# Patient Record
Sex: Female | Born: 1995 | Race: White | Hispanic: No | Marital: Married | Smoking: Never smoker
Health system: Southern US, Community
[De-identification: ages and names within clinical notes are randomized; demographics above are authoritative.]

## PROBLEM LIST (undated history)

## (undated) DIAGNOSIS — E119 Type 2 diabetes mellitus without complications: Secondary | ICD-10-CM

## (undated) HISTORY — PX: NO PAST SURGERIES: SHX2092

---
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
## 2015-03-16 ENCOUNTER — Encounter: Payer: Self-pay | Admitting: Emergency Medicine

## 2015-03-16 ENCOUNTER — Emergency Department
Admission: EM | Admit: 2015-03-16 | Discharge: 2015-03-16 | Disposition: A | Discharge status: Home / Self Care | Payer: No Typology Code available for payment source | Attending: Emergency Medicine | Admitting: Emergency Medicine

## 2015-03-16 DIAGNOSIS — Y998 Other external cause status: Secondary | ICD-10-CM | POA: Insufficient documentation

## 2015-03-16 DIAGNOSIS — S4992XA Unspecified injury of left shoulder and upper arm, initial encounter: Secondary | ICD-10-CM | POA: Diagnosis not present

## 2015-03-16 DIAGNOSIS — Y9389 Activity, other specified: Secondary | ICD-10-CM | POA: Insufficient documentation

## 2015-03-16 DIAGNOSIS — S199XXA Unspecified injury of neck, initial encounter: Secondary | ICD-10-CM | POA: Diagnosis present

## 2015-03-16 DIAGNOSIS — E119 Type 2 diabetes mellitus without complications: Secondary | ICD-10-CM | POA: Diagnosis not present

## 2015-03-16 DIAGNOSIS — S161XXA Strain of muscle, fascia and tendon at neck level, initial encounter: Secondary | ICD-10-CM | POA: Insufficient documentation

## 2015-03-16 DIAGNOSIS — Z794 Long term (current) use of insulin: Secondary | ICD-10-CM | POA: Insufficient documentation

## 2015-03-16 DIAGNOSIS — S3992XA Unspecified injury of lower back, initial encounter: Secondary | ICD-10-CM | POA: Insufficient documentation

## 2015-03-16 DIAGNOSIS — Y92093 Driveway of other non-institutional residence as the place of occurrence of the external cause: Secondary | ICD-10-CM | POA: Diagnosis not present

## 2015-03-16 HISTORY — DX: Type 2 diabetes mellitus without complications: E11.9

## 2015-03-16 MED ORDER — NAPROXEN 500 MG PO TABS
500.0000 mg | ORAL_TABLET | Freq: Once | ORAL | Status: DC
  Filled 2015-03-16: qty 1

## 2015-03-16 MED ORDER — IBUPROFEN 800 MG PO TABS
800.0000 mg | ORAL_TABLET | Freq: Once | ORAL | Status: AC
  Administered 2015-03-16: 800 mg via ORAL

## 2015-03-16 MED ORDER — DIAZEPAM 2 MG PO TABS: 2.0000 mg | ORAL_TABLET | Freq: Three times a day (TID) | ORAL | Status: DC | PRN

## 2015-03-16 MED ORDER — IBUPROFEN 800 MG PO TABS: 800.0000 mg | ORAL_TABLET | Freq: Three times a day (TID) | ORAL | Status: DC | PRN

## 2015-03-16 MED ORDER — DIAZEPAM 2 MG PO TABS
2.0000 mg | ORAL_TABLET | Freq: Once | ORAL | Status: AC
  Administered 2015-03-16: 2 mg via ORAL
  Filled 2015-03-16: qty 1

## 2015-03-16 MED ORDER — IBUPROFEN 800 MG PO TABS
ORAL_TABLET | ORAL | Status: AC
Start: 2015-03-16 — End: 2015-03-16
  Administered 2015-03-16: 800 mg via ORAL
  Filled 2015-03-16: qty 1
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
## 2015-03-16 NOTE — Discharge Instructions (Signed)

## 2015-10-18 DIAGNOSIS — F419 Anxiety disorder, unspecified: Secondary | ICD-10-CM | POA: Insufficient documentation

## 2016-02-23 ENCOUNTER — Ambulatory Visit (HOSPITAL_COMMUNITY)
Admission: RE | Admit: 2016-02-23 | Discharge: 2016-02-23 | Disposition: A | Discharge status: Home / Self Care | Payer: BLUE CROSS/BLUE SHIELD | Source: Ambulatory Visit | Attending: Obstetrics and Gynecology | Admitting: Obstetrics and Gynecology

## 2016-02-23 ENCOUNTER — Encounter (HOSPITAL_COMMUNITY): Payer: Self-pay

## 2016-02-23 ENCOUNTER — Ambulatory Visit (HOSPITAL_COMMUNITY): Payer: No Typology Code available for payment source

## 2016-02-23 ENCOUNTER — Other Ambulatory Visit (HOSPITAL_COMMUNITY): Payer: Self-pay | Admitting: Obstetrics and Gynecology

## 2016-02-23 DIAGNOSIS — Z3A13 13 weeks gestation of pregnancy: Secondary | ICD-10-CM | POA: Insufficient documentation

## 2016-02-23 DIAGNOSIS — O24911 Unspecified diabetes mellitus in pregnancy, first trimester: Secondary | ICD-10-CM | POA: Diagnosis not present

## 2016-02-23 DIAGNOSIS — O283 Abnormal ultrasonic finding on antenatal screening of mother: Secondary | ICD-10-CM | POA: Diagnosis not present

## 2016-02-23 DIAGNOSIS — Z3A12 12 weeks gestation of pregnancy: Secondary | ICD-10-CM

## 2016-02-23 DIAGNOSIS — O289 Unspecified abnormal findings on antenatal screening of mother: Secondary | ICD-10-CM | POA: Insufficient documentation

## 2016-02-23 DIAGNOSIS — R9389 Abnormal findings on diagnostic imaging of other specified body structures: Secondary | ICD-10-CM

## 2016-02-23 NOTE — Progress Notes (Signed)
Genetic Counseling  Visit Summary Note  Appointment Date: 02/23/2016 Referred By: Brien Few  Date of Birth: Nov 27, 1995  Pregnancy history: G1P0 Estimated Date of Delivery: 08/31/16 Estimated Gestational Age: [redacted]w[redacted]d I met with Cheryl [place]ila [place] partner, Cheryl Avila for genetic counseling because of abnormal ultrasound findings.  Cheryl Avila and Cheryl Avila were present also.  In summary:  Discussed ultrasound findings in detail  Reviewed options for additional screening  NIPS - drawn today  Echocardiogram - will be ordered  Anatomy ultrasound - scheduled today  Reviewed options for diagnostic testing, including risks, benefits, limitations and alternatives - will consider amnio at later gestation  Reviewed other explanations for ultrasound findings  Discussed option of continuation vs. Termination of pregnancy  Reviewed family history concerns - none reported  We discussed the ultrasound finding of an increased nuchal translucency (NT) at 3.3 mm.  We reviewed the various common etiologies for an increased NT including: aneuploidy, single gene conditions, cardiac or great vessel abnormalities, lymphatic system failure, decreased fetal movement, and fetal anemia.  Regarding aneuploidy, we discussed that this measurement at the specified [place] is associated with a 6 fold increased risk for Down syndrome.  We reviewed that there is an increased chance for a fetal cardiac defect based on an increased NT.  We also discussed the increased chance for a single gene condition but that these conditions are not routinely tested for prenatally unless ultrasound findings or family history significantly increase the suspicion of a specific single gene disorder.    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  We discussed that an encephalocele is characterized by an opening in the fetal skull, which allows herniation of the meninges and brain into the  amniotic fluid.  The brain tissue is typically covered by a membrane.  We discussed that this finding fits within the neural tube defect spectrum and is associated with a variable prognosis.  The prognosis is largely dependent upon the presence and amount of brain in the herniated sac, presence of other defects, and the underlying etiology.  At this point the encephalocele appears small and does not appear to contain brain tissue.  They were cautioned that it is still very early to evaluate the encephalocele well and with time we may have a better understanding of the significance of this finding.  We discussed that encephaloceles are most commonly considered to be multifactorial in etiology, due to a combination of both environmental and genetic factors.  We reviewed known teratogens associated with an increased risk for NTDs including folic acid antagonists, maternal diabetes, hyperthermia, and folic acid deficiency.    We also reviewed that encephaloceles can be due to an underlying chromosome or single gene condition.  Approximately 5-10% of encephaloceles are associated with a chromosome abnormality, specifically trisomy 13 and 18.  We reviewed chromosomes, nondisjunction, and the common features of the above conditions.  Additionally, we discussed that an encephalocele is a feature of multiple single gene conditions.    We reviewed chromosomes, nondisjunction, and the common features and prognoses of Down syndrome and other aneuploidies. They were then counseled regarding their options of non-invasive prenatal screening, second trimester detailed ultrasound, fetal echocardiogram, and amniocentesis.   We reviewed the benefits, limitations, and risks of these options. We reviewed that NIPS evaluates fragments of fetal (placental) DNA found in maternal circulation to predict the chance of specific chromosome conditions in the fetus.  Although highly specific and sensitive, this testing is not considered  diagnostic.  We reviewed that NIPS specifically assesses the risk of fetal Down syndrome, trisomy 27, trisomy 41, fetal sex chromosome aneuploidy and triploidy. We discussed the possible results that the tests might provide including: positive, negative, unanticipated, and no result. Finally, they were counseled regarding the cost of each option and potential out of pocket expenses.    After thoughtful consideration of their options, Cheryl Avila elected to have NIPS.  They also expressed interest in a detailed ultrasound and fetal echocardiogram at the appropriate gestations.  They were counseled that the fetal prognosis depends on the underlying etiology of the enlarged NT and encephalocele, and further anticipatory guidance can be provided if a diagnosis is discovered.  They understand that the legal limit for termination of pregnancy is [redacted] weeks gestation in [place].   We discussed that fetal anomalies, including neural tube defects and cardiac defects, can also result from teratogenic exposures.  Cheryl Avila denied the use of illicit substances, medications, or alcohol during this pregnancy.  However, she has type 1 diabetes.  We discussed that women who have insulin dependent diabetes are at an increased risk to have a baby with a birth defect.  The increase in risk correlates with the level of blood sugar control during the pregnancy, particularly during organogenesis.  The risk could be as high as 6-10% for individuals whose blood sugars are not well-controlled, but lower for women who have good blood sugar control throughout pregnancy.  Cheryl Avila most recent A1C was 6.6.  Both family histories were reviewed and found to be noncontributory for birth defects, mental retardation, and known genetic conditions. Without further information regarding the provided family history, an accurate genetic risk cannot be calculated. Further genetic counseling is warranted if more information is obtained.  [name] denied exposure to environmental toxins or chemical agents. She denied the use of alcohol, tobacco or street drugs. She denied significant viral illnesses during the course of her pregnancy. Her medical and surgical histories were contributory for type 1 diabetes as discussed above.  Cheryl Avila was provided with written information regarding cystic fibrosis (CF), spinal muscular atrophy (SMA) and hemoglobinopathies including the carrier frequency, availability of carrier screening and prenatal diagnosis if indicated.  In addition, we discussed that CF and hemoglobinopathies are routinely screened for as part of the [place] newborn screening panel.  After further discussion, she declined screening for CF, SMA and hemoglobinopathies at this time.  I counseled this couple regarding the above risks and available options.  The approximate face-to-face time with the genetic counselor was 60 minutes.  Cam Hai, MS,  Certified Genetic Counselor

## 2016-02-24 ENCOUNTER — Other Ambulatory Visit (HOSPITAL_COMMUNITY): Payer: Self-pay

## 2016-02-24 DIAGNOSIS — O24919 Unspecified diabetes mellitus in pregnancy, unspecified trimester: Secondary | ICD-10-CM

## 2016-02-28 ENCOUNTER — Telehealth (HOSPITAL_COMMUNITY): Payer: Self-pay | Admitting: Genetics

## 2016-02-28 ENCOUNTER — Other Ambulatory Visit (HOSPITAL_COMMUNITY): Payer: Self-pay

## 2016-02-28 ENCOUNTER — Encounter (HOSPITAL_COMMUNITY): Payer: Self-pay

## 2016-02-28 NOTE — Telephone Encounter (Signed)
Called Cheryl Avila to discuss her prenatal cell free DNA test results.  Cheryl Avila had Panorama testing through [facility].  Testing was offered because of abnormal ultrasound findings.   The patient was identified by name and DOB.  We reviewed that these are within normal limits, showing a less than 1 in 10,000 risk for trisomies 21, 18 and 13, and monosomy X (Turner syndrome).  In addition, the risk for triploidy/vanishing twin and sex chromosome trisomies (47,XXX and 47,XXY) was also low risk.  We reviewed that this testing identifies > 99% of pregnancies with trisomy 2821, trisomy 4213, sex chromosome trisomies (47,XXX and 47,XXY), and triploidy. The detection rate for trisomy 18 is 96%.  The detection rate for monosomy X is ~92%.  The false positive rate is <0.1% for all conditions. Testing was also consistent with female fetal sex.  She understands that this testing does not identify all genetic conditions and does not eliminate the concern based on the ultrasound finding.  All questions were answered to her satisfaction, she was encouraged to call with additional questions or concerns.  Cheryl Gemmaaragh Cherissa Hook, MS Certified Genetic Counselor

## 2016-03-09 ENCOUNTER — Ambulatory Visit (HOSPITAL_COMMUNITY): Payer: No Typology Code available for payment source | Attending: Obstetrics and Gynecology

## 2016-03-15 ENCOUNTER — Ambulatory Visit (HOSPITAL_COMMUNITY)
Admission: RE | Admit: 2016-03-15 | Discharge: 2016-03-15 | Disposition: A | Discharge status: Home / Self Care | Payer: BLUE CROSS/BLUE SHIELD | Source: Ambulatory Visit | Attending: Obstetrics and Gynecology | Admitting: Obstetrics and Gynecology

## 2016-03-15 ENCOUNTER — Encounter (HOSPITAL_COMMUNITY): Payer: Self-pay

## 2016-03-15 DIAGNOSIS — O24919 Unspecified diabetes mellitus in pregnancy, unspecified trimester: Secondary | ICD-10-CM

## 2016-03-15 DIAGNOSIS — O99212 Obesity complicating pregnancy, second trimester: Secondary | ICD-10-CM | POA: Insufficient documentation

## 2016-03-15 DIAGNOSIS — O283 Abnormal ultrasonic finding on antenatal screening of mother: Secondary | ICD-10-CM | POA: Insufficient documentation

## 2016-03-15 DIAGNOSIS — Z3A15 15 weeks gestation of pregnancy: Secondary | ICD-10-CM | POA: Diagnosis not present

## 2016-03-15 DIAGNOSIS — O24012 Pre-existing diabetes mellitus, type 1, in pregnancy, second trimester: Secondary | ICD-10-CM | POA: Insufficient documentation

## 2016-03-16 ENCOUNTER — Other Ambulatory Visit (HOSPITAL_COMMUNITY): Payer: Self-pay | Admitting: *Deleted

## 2016-03-16 DIAGNOSIS — O350XX0 Maternal care for (suspected) central nervous system malformation in fetus, not applicable or unspecified: Secondary | ICD-10-CM

## 2016-03-16 DIAGNOSIS — O3504X0 Maternal care for (suspected) central nervous system malformation or damage in fetus, encephalocele, not applicable or unspecified: Secondary | ICD-10-CM

## 2016-04-12 ENCOUNTER — Ambulatory Visit (HOSPITAL_COMMUNITY): Payer: No Typology Code available for payment source

## 2016-04-12 ENCOUNTER — Encounter (HOSPITAL_COMMUNITY): Payer: Self-pay

## 2016-10-02 ENCOUNTER — Ambulatory Visit
Admission: EM | Admit: 2016-10-02 | Discharge: 2016-10-02 | Discharge status: Absent without leave | Payer: Medicaid Other

## 2016-11-13 ENCOUNTER — Ambulatory Visit
Admission: EM | Admit: 2016-11-13 | Discharge: 2016-11-13 | Disposition: A | Discharge status: Home / Self Care | Payer: Medicaid Other | Attending: Family Medicine | Admitting: Family Medicine

## 2016-11-13 ENCOUNTER — Encounter: Payer: Self-pay | Admitting: Emergency Medicine

## 2016-11-13 DIAGNOSIS — J069 Acute upper respiratory infection, unspecified: Secondary | ICD-10-CM | POA: Diagnosis not present

## 2016-11-13 DIAGNOSIS — J029 Acute pharyngitis, unspecified: Secondary | ICD-10-CM | POA: Insufficient documentation

## 2016-11-13 LAB — RAPID STREP SCREEN (MED CTR MEBANE ONLY): Streptococcus, Group A Screen (Direct): NEGATIVE

## 2016-11-13 NOTE — ED Provider Notes (Signed)
[facility] ____________________________________________  Time seen: Approximately 4:44 PM  I have reviewed the triage vital signs and the nursing notes.   HISTORY  Chief Complaint Sore Throat  HPI Cheryl Avila is a 21 y.o. female presenting with significant other at bedside for evaluation of runny nose, nasal congestion, cough and sore throat since yesterday. reports her biggest complaint is sore throat, pain with swallowing, states mild currently. Reports has continued to eat and drink well. Denies fevers. States clear runny nose. Nonproductive cough. Denies known sick contacts. Reports unresolved with 1 dose of over-the-counter Sudafed last night. Reports otherwise feels well. Denies any pain radiation. Denies aggravating or alleviating factors.  Denies chest pain, shortness of breath, abdominal pain, dysuria, extremity pain, extremity swelling or rash. Denies recent sickness. Denies recent antibiotic use.   Patient's last menstrual period was 11/12/2016 (exact date). Denies pregnancy. Reports that she is currently breast-feeding/pumping, a 3164-month-old that is in NICU. 4 months postpartum vaginal delivery.    Past Medical History:  Diagnosis Date  . Diabetes mellitus without complication [facility](HCC)     Patient Active Problem List   Diagnosis Date Noted  . [redacted] weeks gestation of pregnancy   . Abnormal findings on antenatal screening     Past Surgical History:  Procedure Laterality Date  . CESAREAN SECTION    . NO PAST SURGERIES       No current facility-administered medications for this encounter.   Current Outpatient Prescriptions:  .  ibuprofen (ADVIL,MOTRIN) 800 MG tablet, Take 1 tablet (800 mg total) by mouth every 8 (eight) hours as needed. (Patient not taking: Reported on 03/15/2016), Disp: 30 tablet, Rfl: 0 .  insulin aspart (NOVOLOG) 100 UNIT/ML FlexPen, Inject into the skin 3 (three) times daily with meals., Disp: , Rfl:  .  insulin glargine  (LANTUS) 100 UNIT/ML injection, Inject into the skin at bedtime., Disp: , Rfl:   Allergies Aleve [naproxen]  History reviewed. No pertinent family history.  Social History Social History  Substance Use Topics  . Smoking status: Never Smoker  . Smokeless tobacco: Never Used  . Alcohol use No    Review of Systems Constitutional: No fever/chills Eyes: No visual changes. ENT:  Positive sore throat. Cardiovascular: Denies chest pain. Respiratory: Denies shortness of breath. Gastrointestinal: No abdominal pain.  No nausea, no vomiting.  No diarrhea.  No constipation. Genitourinary: Negative for dysuria. Musculoskeletal: Negative for back pain. Skin: Negative for rash.   ____________________________________________   PHYSICAL EXAM:  VITAL SIGNS: ED Triage Vitals  Enc Vitals Group     BP 11/13/16 1522 127/81     Pulse Rate 11/13/16 1522 88     Resp 11/13/16 1522 16     Temp 11/13/16 1522 98.2 F (36.8 C)     Temp Source 11/13/16 1522 Oral     SpO2 11/13/16 1522 99 %     Weight 11/13/16 1519 181 lb (82.1 kg)     Height 11/13/16 1519 4\' 11"  (1.499 m)     Head Circumference --      Peak Flow --      Pain Score 11/13/16 1519 6     Pain Loc --      Pain Edu? --      Excl. in GC? --     Constitutional: Alert and oriented. Well appearing and in no acute distress. Eyes: Conjunctivae are normal. PERRL. EOMI. Head: Atraumatic. No sinus tenderness to palpation. No swelling. No erythema.  Ears: no erythema, normal TMs bilaterally.  Nose:Nasal congestion with clear rhinorrhea  Mouth/Throat: Mucous membranes are moist. Mild pharyngeal erythema. No tonsillar swelling or exudate. Posterior pharyngeal cobblestoning noted.  Neck: No stridor.  No cervical spine tenderness to palpation. Hematological/Lymphatic/Immunilogical: No cervical lymphadenopathy. Cardiovascular: Normal rate, regular rhythm. Grossly normal heart sounds.  Good peripheral circulation. Respiratory: Normal  respiratory effort.  No retractions. No wheezes, rales or rhonchi. Good air movement.  Gastrointestinal: Soft and nontender.  Musculoskeletal: Ambulatory with steady gait. No cervical, thoracic or lumbar tenderness to palpation. Neurologic:  Normal speech and language. No gait instability. Skin:  Skin appears warm, dry.  Psychiatric: Mood and affect are normal. Speech and behavior are normal.  ___________________________________________   LABS (all labs ordered are listed, but only abnormal results are displayed)  Labs Reviewed  RAPID STREP SCREEN (NOT AT [facility])  CULTURE, GROUP A STREP [facility])    RADIOLOGY  No results found. ____________________________________________   PROCEDURES Procedures    INITIAL IMPRESSION / ASSESSMENT AND PLAN / ED COURSE  Pertinent labs & imaging results that were available during my care of the patient were reviewed by me and considered in my medical decision making (see chart for details).  Well-appearing patient. No acute distress. Quick strep negative, will culture. Suspect viral upper respiratory infection and viral pharyngitis. Encouraged rest, fluids and supportive care. Discussed with patient over-the-counter medication use, but counseled use of medications being excreted through breast milk including Sudafed causing irritability and issues with young child. Patient states that she will limit over-the-counter medication use as needed. Encourage again rest, fluids, lozenges, and supportive care.   Discussed follow up with Primary care physician this week. Discussed follow up and return parameters including no resolution or any worsening concerns. Patient verbalized understanding and agreed to plan.   ____________________________________________   FINAL CLINICAL IMPRESSION(S) / ED DIAGNOSES  Final diagnoses:  Pharyngitis, unspecified etiology  Upper respiratory tract infection, unspecified type     Discharge Medication List as of 11/13/2016   4:30 PM      Note: This dictation was prepared with Dragon dictation along with smaller phrase technology. Any transcriptional errors that result from this process are unintentional.         Renford Dills, NP 11/13/16 1651

## 2016-11-13 NOTE — ED Triage Notes (Signed)
Patient c/o sore throat for the past 2 days.  Patient denies fevers.  

## 2016-11-13 NOTE — Discharge Instructions (Signed)
Rest. Drink plenty of fluids.  ° °Follow up with your primary care physician this week as needed. Return to Urgent care for new or worsening concerns.  ° °

## 2016-11-14 ENCOUNTER — Ambulatory Visit
Admission: EM | Admit: 2016-11-14 | Discharge: 2016-11-14 | Disposition: A | Discharge status: Home / Self Care | Payer: Medicaid Other | Attending: Family Medicine | Admitting: Family Medicine

## 2016-11-14 DIAGNOSIS — O98511 Other viral diseases complicating pregnancy, first trimester: Secondary | ICD-10-CM | POA: Insufficient documentation

## 2016-11-14 DIAGNOSIS — Z3A12 12 weeks gestation of pregnancy: Secondary | ICD-10-CM | POA: Insufficient documentation

## 2016-11-14 DIAGNOSIS — O99511 Diseases of the respiratory system complicating pregnancy, first trimester: Secondary | ICD-10-CM | POA: Insufficient documentation

## 2016-11-14 DIAGNOSIS — O24911 Unspecified diabetes mellitus in pregnancy, first trimester: Secondary | ICD-10-CM | POA: Insufficient documentation

## 2016-11-14 DIAGNOSIS — J069 Acute upper respiratory infection, unspecified: Secondary | ICD-10-CM | POA: Diagnosis not present

## 2016-11-14 DIAGNOSIS — J029 Acute pharyngitis, unspecified: Secondary | ICD-10-CM | POA: Diagnosis not present

## 2016-11-14 DIAGNOSIS — B9789 Other viral agents as the cause of diseases classified elsewhere: Secondary | ICD-10-CM

## 2016-11-14 DIAGNOSIS — R05 Cough: Secondary | ICD-10-CM | POA: Diagnosis not present

## 2016-11-14 DIAGNOSIS — Z794 Long term (current) use of insulin: Secondary | ICD-10-CM | POA: Diagnosis not present

## 2016-11-14 DIAGNOSIS — J3489 Other specified disorders of nose and nasal sinuses: Secondary | ICD-10-CM | POA: Diagnosis not present

## 2016-11-14 LAB — RAPID STREP SCREEN (MED CTR MEBANE ONLY): STREPTOCOCCUS, GROUP A SCREEN (DIRECT): NEGATIVE

## 2016-11-14 LAB — MONONUCLEOSIS SCREEN: MONO SCREEN: NEGATIVE

## 2016-11-14 MED ORDER — LIDOCAINE VISCOUS 2 % MT SOLN: OROMUCOSAL | 0 refills | Status: DC

## 2016-11-14 NOTE — ED Provider Notes (Signed)
[facility]    CSN: [id number] Arrival date & time: 11/14/16  0814     History   Chief Complaint Chief Complaint  Patient presents with  . Sore Throat    HPI Cheryl Avila is a 21 y.o. female.   The history is provided by the patient.  Sore Throat  Pertinent negatives include no headaches.  URI  Presenting symptoms: congestion, cough, fatigue, rhinorrhea and sore throat   Presenting symptoms: no fever   Severity:  Moderate Onset quality:  Sudden Duration:  3 days Timing:  Constant Progression:  Unchanged Chronicity:  New (patient was seen here yesterday for the same and diagnosed with viral URI; states still having sore throat) Relieved by:  None tried Ineffective treatments:  None tried Associated symptoms: no headaches, no myalgias, no neck pain, no sinus pain and no wheezing   Risk factors: diabetes mellitus   Risk factors: not elderly, no chronic cardiac disease, no chronic kidney disease, no chronic respiratory disease, no immunosuppression, no recent illness, no recent travel and no sick contacts     Past Medical History:  Diagnosis Date  . Diabetes mellitus without complication [facility])     Patient Active Problem List   Diagnosis Date Noted  . [redacted] weeks gestation of pregnancy   . Abnormal findings on antenatal screening     Past Surgical History:  Procedure Laterality Date  . CESAREAN SECTION    . NO PAST SURGERIES      OB History    Gravida Para Term Preterm AB Living   1             SAB TAB Ectopic Multiple Live Births                   Home Medications    Prior to Admission medications   Medication Sig Start Date End Date Taking? Authorizing Provider  ibuprofen (ADVIL,MOTRIN) 800 MG tablet Take 1 tablet (800 mg total) by mouth every 8 (eight) hours as needed. 03/16/15  Yes Triplett, Cari B, FNP  insulin aspart (NOVOLOG) 100 UNIT/ML FlexPen Inject into the skin 3 (three) times daily with meals.   Yes [provider]    insulin glargine (LANTUS) 100 UNIT/ML injection Inject into the skin at bedtime.   Yes [provider]  lidocaine (XYLOCAINE) 2 % solution 20 mls gargle and spit q 8 hours prn 11/14/16   Payton Mccallum, MD    Family History History reviewed. No pertinent family history.  Social History Social History  Substance Use Topics  . Smoking status: Never Smoker  . Smokeless tobacco: Never Used  . Alcohol use No     Allergies   Aleve [naproxen]   Review of Systems Review of Systems  Constitutional: Positive for fatigue. Negative for fever.  HENT: Positive for congestion, rhinorrhea and sore throat. Negative for sinus pain.   Respiratory: Positive for cough. Negative for wheezing.   Musculoskeletal: Negative for myalgias and neck pain.  Neurological: Negative for headaches.     Physical Exam Triage Vital Signs ED Triage Vitals  Enc Vitals Group     BP 11/14/16 0901 124/81     Pulse Rate 11/14/16 0901 (!) 101     Resp 11/14/16 0901 18     Temp 11/14/16 0901 98.6 F (37 C)     Temp Source 11/14/16 0901 Oral     SpO2 11/14/16 0901 100 %     Weight 11/14/16 0900 181 lb (82.1 kg)  Height 11/14/16 0900 4\' 11"  (1.499 m)     Head Circumference --      Peak Flow --      Pain Score 11/14/16 0900 8     Pain Loc --      Pain Edu? --      Excl. in GC? --    No data found.   Updated Vital Signs BP 124/81 (BP Location: Left Arm)   Pulse (!) 101   Temp 98.6 F (37 C) (Oral)   Resp 18   Ht 4\' 11"  (1.499 m)   Wt 181 lb (82.1 kg)   LMP 11/12/2016 (Exact Date)   SpO2 100%   Breastfeeding? Yes   BMI 36.56 kg/m   Visual Acuity Right Eye Distance:   Left Eye Distance:   Bilateral Distance:    Right Eye Near:   Left Eye Near:    Bilateral Near:     Physical Exam  Constitutional: She appears well-developed and well-nourished. No distress.  HENT:  Head: Normocephalic and atraumatic.  Right Ear: Tympanic membrane, external ear and ear canal normal.  Left Ear:  Tympanic membrane, external ear and ear canal normal.  Nose: Rhinorrhea present. No mucosal edema, nose lacerations, sinus tenderness, nasal deformity, septal deviation or nasal septal hematoma. No epistaxis.  No foreign bodies. Right sinus exhibits no maxillary sinus tenderness and no frontal sinus tenderness. Left sinus exhibits no maxillary sinus tenderness and no frontal sinus tenderness.  Mouth/Throat: Uvula is midline, oropharynx is clear and moist and mucous membranes are normal. No oropharyngeal exudate.  Eyes: Conjunctivae and EOM are normal. Pupils are equal, round, and reactive to light. Right eye exhibits no discharge. Left eye exhibits no discharge. No scleral icterus.  Neck: Normal range of motion. Neck supple. No thyromegaly present.  Cardiovascular: Normal rate, regular rhythm and normal heart sounds.   Pulmonary/Chest: Effort normal and breath sounds normal. No respiratory distress. She has no wheezes. She has no rales.  Lymphadenopathy:    She has no cervical adenopathy.  Skin: No rash noted. She is not diaphoretic.  Nursing note and vitals reviewed.    UC Treatments / Results  Labs (all labs ordered are listed, but only abnormal results are displayed) Labs Reviewed  RAPID STREP SCREEN (NOT AT [facility])  CULTURE, GROUP A STREP [facility])  MONONUCLEOSIS SCREEN    EKG  EKG Interpretation None       Radiology No results found.  Procedures Procedures (including critical care time)  Medications Ordered in UC Medications - No data to display   Initial Impression / Assessment and Plan / UC Course  I have reviewed the triage vital signs and the nursing notes.  Pertinent labs & imaging results that were available during my care of the patient were reviewed by me and considered in my medical decision making (see chart for details).       Final Clinical Impressions(s) / UC Diagnoses   Final diagnoses:  Viral pharyngitis  Viral URI with cough    New  Prescriptions Discharge Medication List as of 11/14/2016 10:12 AM    START taking these medications   Details  lidocaine (XYLOCAINE) 2 % solution 20 mls gargle and spit q 8 hours prn, Normal       1. Lab results and diagnosis reviewed with patient 2. rx as per orders above; reviewed possible side effects, interactions, risks and benefits  3. Recommend supportive treatment with rest, increased fluids, otc analgesics prn 4. Follow-up prn if symptoms worsen or don't  improve   Payton Mccallumonty, Brenlynn Fake, MD 11/14/16 1121

## 2016-11-14 NOTE — ED Triage Notes (Signed)
Patient complains of sore throat, body aches, cough. Patient states that she was seen here yesterday for sore throat but feels like she worsened. Patient reports some swollen glands in her throat as well.

## 2016-11-16 ENCOUNTER — Telehealth: Payer: Self-pay | Admitting: *Deleted

## 2016-11-16 LAB — CULTURE, GROUP A STREP (THRC)

## 2016-11-16 MED ORDER — AMOXICILLIN 875 MG PO TABS: 875.0000 mg | ORAL_TABLET | Freq: Two times a day (BID) | ORAL | 0 refills | Status: AC

## 2016-11-16 NOTE — Telephone Encounter (Signed)
Prescription for amoxicillin sent to pharmacy on record.

## 2019-09-14 ENCOUNTER — Emergency Department: Payer: Medicaid Other

## 2019-09-14 ENCOUNTER — Other Ambulatory Visit: Payer: Self-pay

## 2019-09-14 ENCOUNTER — Emergency Department
Admission: EM | Admit: 2019-09-14 | Discharge: 2019-09-14 | Disposition: A | Discharge status: Home / Self Care | Payer: Medicaid Other | Attending: Student | Admitting: Student

## 2019-09-14 ENCOUNTER — Encounter: Payer: Self-pay | Admitting: Emergency Medicine

## 2019-09-14 DIAGNOSIS — Z794 Long term (current) use of insulin: Secondary | ICD-10-CM | POA: Insufficient documentation

## 2019-09-14 DIAGNOSIS — E109 Type 1 diabetes mellitus without complications: Secondary | ICD-10-CM | POA: Diagnosis not present

## 2019-09-14 DIAGNOSIS — R1030 Lower abdominal pain, unspecified: Secondary | ICD-10-CM | POA: Insufficient documentation

## 2019-09-14 DIAGNOSIS — Z79899 Other long term (current) drug therapy: Secondary | ICD-10-CM | POA: Diagnosis not present

## 2019-09-14 DIAGNOSIS — R109 Unspecified abdominal pain: Secondary | ICD-10-CM

## 2019-09-14 DIAGNOSIS — R102 Pelvic and perineal pain: Secondary | ICD-10-CM | POA: Diagnosis not present

## 2019-09-14 LAB — COMPREHENSIVE METABOLIC PANEL
ALT: 27 U/L (ref 0–44)
AST: 21 U/L (ref 15–41)
Albumin: 4.3 g/dL (ref 3.5–5.0)
Alkaline Phosphatase: 66 U/L (ref 38–126)
Anion gap: 9 (ref 5–15)
BUN: 21 mg/dL — ABNORMAL HIGH (ref 6–20)
CO2: 24 mmol/L (ref 22–32)
Calcium: 9.1 mg/dL (ref 8.9–10.3)
Chloride: 99 mmol/L (ref 98–111)
Creatinine, Ser: 0.73 mg/dL (ref 0.44–1.00)
GFR calc Af Amer: >60 mL/min (ref >60)
GFR calc non Af Amer: >60 mL/min (ref >60)
Glucose, Bld: 257 mg/dL — ABNORMAL HIGH (ref 70–99)
Potassium: 3.8 mmol/L (ref 3.5–5.1)
Sodium: 132 mmol/L — ABNORMAL LOW (ref 135–145)
Total Bilirubin: 1.3 mg/dL — ABNORMAL HIGH (ref 0.3–1.2)
Total Protein: 7.8 g/dL (ref 6.5–8.1)

## 2019-09-14 LAB — POCT PREGNANCY, URINE: Preg Test, Ur: NEGATIVE

## 2019-09-14 LAB — URINALYSIS, COMPLETE (UACMP) WITH MICROSCOPIC
Bacteria, UA: NONE SEEN
Bilirubin Urine: NEGATIVE
Glucose, UA: >=500 mg/dL — AB
Hgb urine dipstick: NEGATIVE
Ketones, ur: 20 mg/dL — AB
Leukocytes,Ua: NEGATIVE
Nitrite: NEGATIVE
Protein, ur: NEGATIVE mg/dL
Specific Gravity, Urine: 1.035 — ABNORMAL HIGH (ref 1.005–1.030)
pH: 5 (ref 5.0–8.0)

## 2019-09-14 LAB — CBC WITH DIFFERENTIAL/PLATELET
Abs Immature Granulocytes: 0.01 10*3/uL (ref 0.00–0.07)
Basophils Absolute: 0.1 10*3/uL (ref 0.0–0.1)
Basophils Relative: 1 %
Eosinophils Absolute: 0.1 10*3/uL (ref 0.0–0.5)
Eosinophils Relative: 1 %
HCT: 44.9 % (ref 36.0–46.0)
Hemoglobin: 15.7 g/dL — ABNORMAL HIGH (ref 12.0–15.0)
Immature Granulocytes: 0 %
Lymphocytes Relative: 29 %
Lymphs Abs: 1.9 10*3/uL (ref 0.7–4.0)
MCH: 31.2 pg (ref 26.0–34.0)
MCHC: 35 g/dL (ref 30.0–36.0)
MCV: 89.1 fL (ref 80.0–100.0)
Monocytes Absolute: 0.4 10*3/uL (ref 0.1–1.0)
Monocytes Relative: 6 %
Neutro Abs: 4 10*3/uL (ref 1.7–7.7)
Neutrophils Relative %: 63 %
Platelets: 293 10*3/uL (ref 150–400)
RBC: 5.04 MIL/uL (ref 3.87–5.11)
RDW: 11.9 % (ref 11.5–15.5)
WBC: 6.5 10*3/uL (ref 4.0–10.5)
nRBC: 0 % (ref 0.0–0.2)

## 2019-09-14 LAB — PREGNANCY, URINE: Preg Test, Ur: NEGATIVE

## 2019-09-14 MED ORDER — IOHEXOL 300 MG/ML  SOLN
100.0000 mL | Freq: Once | INTRAMUSCULAR | Status: AC | PRN
  Administered 2019-09-14: 100 mL via INTRAVENOUS

## 2019-09-14 MED ORDER — DICYCLOMINE HCL 20 MG PO TABS: 20.0000 mg | ORAL_TABLET | Freq: Three times a day (TID) | ORAL | 0 refills | Status: DC | PRN

## 2019-09-14 MED ORDER — ONDANSETRON HCL 4 MG/2ML IJ SOLN
4.0000 mg | Freq: Once | INTRAMUSCULAR | Status: AC
  Administered 2019-09-14: 4 mg via INTRAVENOUS
  Filled 2019-09-14: qty 2

## 2019-09-14 NOTE — ED Notes (Signed)
Pt ambulatory to toilet with steady gait noted. Hat placed in toilet to be able to measure urinary output.

## 2019-09-14 NOTE — ED Triage Notes (Signed)
Pt to ED via POV c/o "pain in my ovaries". Pt states that the pain started last night. Pt is in NAD.

## 2019-09-14 NOTE — Discharge Instructions (Addendum)
Thank you for letting us take care of you in the emergency department today.   Please continue to take any regular, prescribed medications.   New medications we have prescribed:  Bentyl, as needed for abdominal cramping  Please follow up with: Your primary care doctor to review your ER visit and follow up on your symptoms.    Please return to the ER for any new or worsening symptoms.   

## 2019-09-14 NOTE — ED Notes (Signed)
Pt in CT. Provided sprite, ok per EDP.

## 2019-09-14 NOTE — ED Provider Notes (Signed)
[facility] Emergency Department Provider Note  ____________________________________________   First MD Initiated Contact with Patient 09/14/19 [phone]     (approximate)  I have reviewed the triage vital signs and the nursing notes.  History  Chief Complaint Abdominal Pain    HPI Cheryl Avila is a 24 y.o. female with a history of type 1 diabetes who presents to the emergency department for lower abdominal/pelvic pain. Patient states the pain started last night. She feels like the pain is in pelvis & lower abdomen area. Located across her lower abdomen, 7/10 in severity, describes as cramping/shooting/stabbing. No radiation. No alleviating or aggravating components. This comes and goes in waves w/ no obvious trigger. She denies any cramping or discomfort at present. She states with the waves of cramping she feels like she also has associated abdominal bloating as well as nausea. Denies any vomiting or diarrhea. No fevers. Past surgical history includes C-section. She denies any dysuria or malodorous urine. No vaginal discharge or vaginal bleeding. Has Nexplanon birth control in place. Denies any concern for STD exposure.   Past Medical Hx Past Medical History:  Diagnosis Date  . Diabetes mellitus without complication [facility])     Problem List Patient Active Problem List   Diagnosis Date Noted  . [redacted] weeks gestation of pregnancy   . Abnormal findings on antenatal screening     Past Surgical Hx Past Surgical History:  Procedure Laterality Date  . CESAREAN SECTION    . NO PAST SURGERIES      Medications Prior to Admission medications   Medication Sig Start Date End Date Taking? Authorizing Provider  ibuprofen (ADVIL,MOTRIN) 800 MG tablet Take 1 tablet (800 mg total) by mouth every 8 (eight) hours as needed. 03/16/15  Yes Triplett, Cari B, FNP  insulin aspart (NOVOLOG) 100 UNIT/ML FlexPen Inject 0-30 Units into the skin 3 (three) times daily with  meals. Sliding scale   Yes [provider]  Insulin Glargine (BASAGLAR KWIKPEN [place]) Inject 38 Units into the skin at bedtime.   Yes [provider]    Allergies Aleve [naproxen]  Family Hx No family history on file.  Social Hx Social History   Tobacco Use  . Smoking status: Never Smoker  . Smokeless tobacco: Never Used  Substance Use Topics  . Alcohol use: No  . Drug use: No     Review of Systems  Constitutional: Negative for fever. Negative for chills. Eyes: Negative for visual changes. ENT: Negative for sore throat. Cardiovascular: Negative for chest pain. Respiratory: Negative for shortness of breath. Gastrointestinal: Negative for nausea. Negative for vomiting.  + lower abdominal pain Genitourinary: Negative for dysuria. Musculoskeletal: Negative for leg swelling. Skin: Negative for rash. Neurological: Negative for headaches.   Physical Exam  Vital Signs: ED Triage Vitals  Enc Vitals Group     BP 09/14/19 0852 (!) 141/82     Pulse Rate 09/14/19 0850 98     Resp 09/14/19 0850 16     Temp 09/14/19 0850 97.7 F (36.5 C)     Temp Source 09/14/19 0850 Oral     SpO2 09/14/19 0850 100 %     Weight 09/14/19 0851 190 lb (86.2 kg)     Height 09/14/19 0851 5' (1.524 m)     Head Circumference --      Peak Flow --      Pain Score 09/14/19 0850 7     Pain Loc --      Pain Edu? --  Excl. in [place]? --     Constitutional: Alert and oriented. Well appearing. NAD.  Head: Normocephalic. Atraumatic. Eyes: Conjunctivae clear. Sclera anicteric. Pupils equal and symmetric. Nose: No masses or lesions. No congestion or rhinorrhea. Mouth/Throat: Wearing mask.  Neck: No stridor. Trachea midline.  Cardiovascular: Normal rate, regular rhythm. Extremities well perfused. Respiratory: Normal respiratory effort.  Lungs CTAB. Gastrointestinal: Soft. Non-distended. TTP across lower abdomen, R > L. Genitourinary: Deferred. Offered to patient, but declined as she  has no concern for STD exposure and no vaginal bleeding or discharge. Musculoskeletal: No lower extremity edema. No deformities. Neurologic:  Normal speech and language. No gross focal or lateralizing neurologic deficits are appreciated.  Skin: Skin is warm, dry and intact. No rash noted. Psychiatric: Mood and affect are appropriate for situation.  EKG  N/A    Radiology  Personally reviewed available imaging myself.   US Pelvis IMPRESSION:  Normal pelvic ultrasound and ovarian Doppler   CT A/P IMPRESSION: 1. No acute abdominal/pelvic findings, mass lesions or adenopathy. 2. Moderate bladder distension with estimated volume of 400 CC.   Procedures  Procedure(s) performed (including critical care):  Procedures   Initial Impression / Assessment and Plan / MDM / ED Course  24 y.o. female who presents to the ED for lower abdominal pain/cramping and bloating  Ddx: ovarian torsion, intestinal cramping, colitis, constipation, appendicits  Will plan for labs, urine studies, start with US imaging. If negative, may need to consider CT.  Clinical Course as of Sep 13 1128  Sun Sep 14, 2019  1038 Ultrasound pelvis normal, including normal flow.   [SM]  0626 Will proceed with CT to r/o appy, given R > L on exam. Patient and family agreeable.   [SM]    Clinical Course User Index [SM] Lilia Pro., MD   CT negative for acute findings. Moderate amount of urine in bladder. Patient able to urinate after CT without difficulty. Reports she continues to feel well. Updated patient and family at bedside on results. Patient feels comfortable with discharge. Advised supportive care, Rx for Bentyl provided. Given return precautions.  _______________________________   As part of my medical decision making I have reviewed available labs, radiology tests, reviewed old records/chart review, obtained additional history from family at bedside.    Final Clinical Impression(s) / ED  Diagnosis  Final diagnoses:  Lower abdominal pain       Note:  This document was prepared using Dragon voice recognition software and may include unintentional dictation errors.   Lilia Pro., MD 09/14/19 [phone]

## 2019-09-14 NOTE — ED Notes (Signed)
Pt in US

## 2019-09-14 NOTE — ED Notes (Signed)
Pt states lower abd pain/pelvic pain. Denies urinary symptoms. Denies vaginal bleeding. Denies N&V&D. States in high school had an ovarian cyst on L side that ruptured.

## 2019-09-29 ENCOUNTER — Emergency Department
Admission: EM | Admit: 2019-09-29 | Discharge: 2019-09-29 | Disposition: A | Discharge status: Home / Self Care | Payer: Medicaid Other | Attending: Emergency Medicine | Admitting: Emergency Medicine

## 2019-09-29 ENCOUNTER — Encounter: Payer: Self-pay | Admitting: Emergency Medicine

## 2019-09-29 ENCOUNTER — Other Ambulatory Visit: Payer: Self-pay

## 2019-09-29 DIAGNOSIS — E119 Type 2 diabetes mellitus without complications: Secondary | ICD-10-CM | POA: Insufficient documentation

## 2019-09-29 DIAGNOSIS — Z3A12 12 weeks gestation of pregnancy: Secondary | ICD-10-CM | POA: Insufficient documentation

## 2019-09-29 DIAGNOSIS — Z794 Long term (current) use of insulin: Secondary | ICD-10-CM | POA: Insufficient documentation

## 2019-09-29 DIAGNOSIS — L255 Unspecified contact dermatitis due to plants, except food: Secondary | ICD-10-CM

## 2019-09-29 DIAGNOSIS — O26891 Other specified pregnancy related conditions, first trimester: Secondary | ICD-10-CM | POA: Diagnosis not present

## 2019-09-29 DIAGNOSIS — L237 Allergic contact dermatitis due to plants, except food: Secondary | ICD-10-CM | POA: Insufficient documentation

## 2019-09-29 MED ORDER — METHYLPREDNISOLONE 4 MG PO TBPK: ORAL_TABLET | ORAL | 0 refills | Status: DC

## 2019-09-29 MED ORDER — METHYLPREDNISOLONE SODIUM SUCC 125 MG IJ SOLR
125.0000 mg | Freq: Once | INTRAMUSCULAR | Status: AC
  Administered 2019-09-29: 125 mg via INTRAMUSCULAR
  Filled 2019-09-29: qty 2

## 2019-09-29 MED ORDER — HYDROXYZINE HCL 50 MG PO TABS
50.0000 mg | ORAL_TABLET | Freq: Once | ORAL | Status: AC
  Administered 2019-09-29: 50 mg via ORAL
  Filled 2019-09-29: qty 1

## 2019-09-29 MED ORDER — HYDROXYZINE HCL 50 MG PO TABS: 50.0000 mg | ORAL_TABLET | Freq: Three times a day (TID) | ORAL | 0 refills | Status: DC | PRN

## 2019-09-29 NOTE — ED Provider Notes (Signed)
[facility] Emergency Department Provider Note   ____________________________________________   First MD Initiated Contact with Patient 09/29/19 [phone]     (approximate)  I have reviewed the triage vital signs and the nursing notes.   HISTORY  Chief Complaint Poison Ivy    HPI Cheryl Avila is a 24 y.o. female patient complains of itching and swelling to the facial area.  Patient became in contact with poison ivory 2 days ago.  Patient denies vision loss or dyspnea.  Patient rates her discomfort as 8/10.  Described as "itching".  No palliative measure for complaint.         Past Medical History:  Diagnosis Date  . Diabetes mellitus without complication [facility])     Patient Active Problem List   Diagnosis Date Noted  . [redacted] weeks gestation of pregnancy   . Abnormal findings on antenatal screening     Past Surgical History:  Procedure Laterality Date  . CESAREAN SECTION    . NO PAST SURGERIES      Prior to Admission medications   Medication Sig Start Date End Date Taking? Authorizing Provider  dicyclomine (BENTYL) 20 MG tablet Take 1 tablet (20 mg total) by mouth every 8 (eight) hours as needed for up to 7 days for spasms (cramping). 09/14/19 09/21/19  Lilia Pro., MD  hydrOXYzine (ATARAX/VISTARIL) 50 MG tablet Take 1 tablet (50 mg total) by mouth 3 (three) times daily as needed for itching. 09/29/19   Sable Feil, PA-C  ibuprofen (ADVIL,MOTRIN) 800 MG tablet Take 1 tablet (800 mg total) by mouth every 8 (eight) hours as needed. 03/16/15   Triplett, Cari B, FNP  insulin aspart (NOVOLOG) 100 UNIT/ML FlexPen Inject 0-30 Units into the skin 3 (three) times daily with meals. Sliding scale    [provider]  Insulin Glargine (BASAGLAR KWIKPEN ) Inject 38 Units into the skin at bedtime.    [provider]  methylPREDNISolone (MEDROL DOSEPAK) 4 MG TBPK tablet Take Tapered dose as directed 09/29/19   Sable Feil, PA-C     Allergies Aleve [naproxen]  No family history on file.  Social History Social History   Tobacco Use  . Smoking status: Never Smoker  . Smokeless tobacco: Never Used  Substance Use Topics  . Alcohol use: No  . Drug use: No    Review of Systems Constitutional: No fever/chills Eyes: No visual changes. ENT: No sore throat. Cardiovascular: Denies chest pain. Respiratory: Denies shortness of breath. Gastrointestinal: No abdominal pain.  No nausea, no vomiting.  No diarrhea.  No constipation. Genitourinary: Negative for dysuria. Musculoskeletal: Negative for back pain. Skin: Positive for rash. Neurological: Negative for headaches, focal weakness or numbness.  Endocrine:  Diabetes Allergic/Immunilogical: Naproxen  ____________________________________________   PHYSICAL EXAM:  VITAL SIGNS: ED Triage Vitals  Enc Vitals Group     BP 09/29/19 0716 118/80     Pulse Rate 09/29/19 0716 93     Resp 09/29/19 0716 18     Temp 09/29/19 0716 98.3 F (36.8 C)     Temp Source 09/29/19 0716 Oral     SpO2 09/29/19 0716 99 %     Weight 09/29/19 0716 193 lb (87.5 kg)     Height 09/29/19 0716 5' (1.524 m)     Head Circumference --      Peak Flow --      Pain Score 09/29/19 0720 8     Pain Loc --      Pain Edu? --  Excl. in GC? --    Constitutional: Alert and oriented. Well appearing and in no acute distress. Cardiovascular: Normal rate, regular rhythm. Grossly normal heart sounds.  Good peripheral circulation. Respiratory: Normal respiratory effort.  No retractions. Lungs CTAB. Musculoskeletal: No lower extremity tenderness nor edema.  No joint effusions. Neurologic:  Normal speech and language. No gross focal neurologic deficits are appreciated. No gait instability. Skin:  Skin is warm, dry and intact.  Vesicular lesions on erythematous and edematous base left supraorbital area. Psychiatric: Mood and affect are normal. Speech and behavior are  normal.  ____________________________________________   LABS (all labs ordered are listed, but only abnormal results are displayed)  Labs Reviewed - No data to display ____________________________________________  EKG   ____________________________________________  RADIOLOGY  ED MD interpretation:    Official radiology report(s): No results found.  ____________________________________________   PROCEDURES  Procedure(s) performed (including Critical Care):  Procedures   ____________________________________________   INITIAL IMPRESSION / ASSESSMENT AND PLAN / ED COURSE  As part of my medical decision making, I reviewed the following data within the electronic MEDICAL RECORD NUMBER     Patient presents with edema erythema physical lesion facial area.  Physical exam is consistent with contact dermatitis.  Patient given discharge care instruction.  Patient was given Solu-Medrol and Atarax prior to departure.  Patient given prescription Medrol Dosepak and Atarax.  Patient advised to closely monitor her blood sugars while taking steroids.    Cheryl Avila was evaluated in Emergency Department on 09/29/2019 for the symptoms described in the history of present illness. She was evaluated in the context of the global COVID-19 pandemic, which necessitated consideration that the patient might be at risk for infection with the SARS-CoV-2 virus that causes COVID-19. Institutional protocols and algorithms that pertain to the evaluation of patients at risk for COVID-19 are in a state of rapid change based on information released by regulatory bodies including the CDC and federal and state organizations. These policies and algorithms were followed during the patient's care in the ED.       ____________________________________________   FINAL CLINICAL IMPRESSION(S) / ED DIAGNOSES  Final diagnoses:  Contact dermatitis due to plant     ED Discharge Orders         Ordered     methylPREDNISolone (MEDROL DOSEPAK) 4 MG TBPK tablet     09/29/19 0736    hydrOXYzine (ATARAX/VISTARIL) 50 MG tablet  3 times daily PRN     09/29/19 0736           Note:  This document was prepared using Dragon voice recognition software and may include unintentional dictation errors.    Joni Reining, PA-C 09/29/19 6160    Emily Filbert, MD 09/29/19 [phone]

## 2019-09-29 NOTE — ED Notes (Signed)
Pt with c/o neck and right arm pain s/p solumedrol injection. States pain 10/10. Able to move arm, sensation intact. edp aware.

## 2019-09-29 NOTE — ED Notes (Signed)
Pt with facial swelling, primarily left side.

## 2019-09-29 NOTE — ED Triage Notes (Signed)
Patient report poison ivy to face. Eyes swollen. Denies trouble breathing or swallowing.

## 2020-12-31 ENCOUNTER — Other Ambulatory Visit: Payer: Self-pay

## 2020-12-31 ENCOUNTER — Emergency Department
Admission: EM | Admit: 2020-12-31 | Discharge: 2020-12-31 | Disposition: A | Discharge status: Home / Self Care | Payer: Medicaid Other | Attending: Emergency Medicine | Admitting: Emergency Medicine

## 2020-12-31 ENCOUNTER — Encounter: Payer: Self-pay | Admitting: Emergency Medicine

## 2020-12-31 DIAGNOSIS — R070 Pain in throat: Secondary | ICD-10-CM | POA: Diagnosis present

## 2020-12-31 DIAGNOSIS — J020 Streptococcal pharyngitis: Secondary | ICD-10-CM | POA: Diagnosis not present

## 2020-12-31 DIAGNOSIS — E119 Type 2 diabetes mellitus without complications: Secondary | ICD-10-CM | POA: Insufficient documentation

## 2020-12-31 DIAGNOSIS — Z794 Long term (current) use of insulin: Secondary | ICD-10-CM | POA: Insufficient documentation

## 2020-12-31 LAB — URINALYSIS, COMPLETE (UACMP) WITH MICROSCOPIC
Bacteria, UA: NONE SEEN
Bilirubin Urine: NEGATIVE
Glucose, UA: >=500 mg/dL — AB
Hgb urine dipstick: NEGATIVE
Ketones, ur: 5 mg/dL — AB
Leukocytes,Ua: NEGATIVE
Nitrite: NEGATIVE
Protein, ur: NEGATIVE mg/dL
Specific Gravity, Urine: 1.027 (ref 1.005–1.030)
WBC, UA: NONE SEEN WBC/hpf (ref 0–5)
pH: 5 (ref 5.0–8.0)

## 2020-12-31 LAB — POC URINE PREG, ED: Preg Test, Ur: NEGATIVE

## 2020-12-31 LAB — GROUP A STREP BY PCR: Group A Strep by PCR: NOT DETECTED

## 2020-12-31 LAB — CBG MONITORING, ED: Glucose-Capillary: 366 mg/dL — ABNORMAL HIGH (ref 70–99)

## 2020-12-31 MED ORDER — OXYCODONE-ACETAMINOPHEN 5-325 MG PO TABS
2.0000 | ORAL_TABLET | Freq: Once | ORAL | Status: AC
  Administered 2020-12-31: 2 via ORAL
  Filled 2020-12-31: qty 2

## 2020-12-31 MED ORDER — LIDOCAINE VISCOUS HCL 2 % MT SOLN
15.0000 mL | Freq: Once | OROMUCOSAL | Status: AC
  Administered 2020-12-31: 15 mL via OROMUCOSAL
  Filled 2020-12-31: qty 15

## 2020-12-31 MED ORDER — ONDANSETRON 4 MG PO TBDP
4.0000 mg | ORAL_TABLET | Freq: Once | ORAL | Status: AC
  Administered 2020-12-31: 4 mg via ORAL
  Filled 2020-12-31: qty 1

## 2020-12-31 MED ORDER — LIDOCAINE HCL (PF) 1 % IJ SOLN
5.0000 mL | Freq: Once | INTRAMUSCULAR | Status: AC
  Administered 2020-12-31: 5 mL

## 2020-12-31 MED ORDER — CEFTRIAXONE SODIUM 1 G IJ SOLR
1.0000 g | Freq: Once | INTRAMUSCULAR | Status: AC
  Administered 2020-12-31: 1 g via INTRAMUSCULAR
  Filled 2020-12-31: qty 10

## 2020-12-31 MED ORDER — AMOXICILLIN-POT CLAVULANATE 875-125 MG PO TABS: 1.0000 | ORAL_TABLET | Freq: Two times a day (BID) | ORAL | 0 refills | Status: AC

## 2020-12-31 MED ORDER — IBUPROFEN 600 MG PO TABS: 600.0000 mg | ORAL_TABLET | Freq: Four times a day (QID) | ORAL | 0 refills | Status: DC | PRN

## 2020-12-31 MED ORDER — HYDROCODONE-ACETAMINOPHEN 7.5-325 MG/15ML PO SOLN: 10.0000 mL | Freq: Four times a day (QID) | ORAL | 0 refills | Status: DC | PRN

## 2020-12-31 MED ORDER — HYDROCODONE-ACETAMINOPHEN 5-325 MG PO TABS: 1.0000 | ORAL_TABLET | Freq: Four times a day (QID) | ORAL | 0 refills | Status: DC | PRN

## 2020-12-31 NOTE — ED Triage Notes (Signed)
Sore throat and not feeling well for ffew days.  Has done covid test and they were neg.  She is worried she has strep

## 2020-12-31 NOTE — ED Provider Notes (Signed)
[facility] Emergency Department Provider Note  ____________________________________________   Event Date/Time   First MD Initiated Contact with Patient 12/31/20 [phone]     (approximate)  I have reviewed the triage vital signs and the nursing notes.   HISTORY  Chief Complaint Sore Throat    HPI Cheryl Avila is a 25 y.o. female here with sore throat.  The patient states that for the last 48 hours, she has had progressively worsening aching, throbbing, sore throat.  The pain is worse with swallowing.  She has had some tender lymph nodes.  Denies any cough.  She said some mild nausea and headache.  No vomiting.  Denies known sick contacts.  Denies any known sick contacts, though she did have a reported GI bug last week, along with her children.  Denies any shortness of breath.  No other acute complaints.  No possible ingested foreign body.  No shortness of breath at rest.  No neck stiffness.  No photophobia.    Past Medical History:  Diagnosis Date   Diabetes mellitus without complication [facility])     Patient Active Problem List   Diagnosis Date Noted   [redacted] weeks gestation of pregnancy    Abnormal findings on antenatal screening     Past Surgical History:  Procedure Laterality Date   CESAREAN SECTION     NO PAST SURGERIES      Prior to Admission medications   Medication Sig Start Date End Date Taking? Authorizing Provider  amoxicillin-clavulanate (AUGMENTIN) 875-125 MG tablet Take 1 tablet by mouth 2 (two) times daily for 7 days. 12/31/20 01/07/21 Yes Shaune Pollack, MD  HYDROcodone-acetaminophen (HYCET) 7.5-325 mg/15 ml solution Take 10 mLs by mouth every 6 (six) hours as needed for severe pain. 12/31/20 12/31/21 Yes Shaune Pollack, MD  ibuprofen (ADVIL) 600 MG tablet Take 1 tablet (600 mg total) by mouth every 6 (six) hours as needed for moderate pain or fever. 12/31/20  Yes Shaune Pollack, MD  dicyclomine (BENTYL) 20 MG tablet Take 1 tablet (20  mg total) by mouth every 8 (eight) hours as needed for up to 7 days for spasms (cramping). 09/14/19 09/21/19  Miguel Aschoff., MD  hydrOXYzine (ATARAX/VISTARIL) 50 MG tablet Take 1 tablet (50 mg total) by mouth 3 (three) times daily as needed for itching. 09/29/19   Joni Reining, PA-C  insulin aspart (NOVOLOG) 100 UNIT/ML FlexPen Inject 0-30 Units into the skin 3 (three) times daily with meals. Sliding scale    [provider]  Insulin Glargine (BASAGLAR KWIKPEN [place]) Inject 38 Units into the skin at bedtime.    [provider]  methylPREDNISolone (MEDROL DOSEPAK) 4 MG TBPK tablet Take Tapered dose as directed 09/29/19   Joni Reining, PA-C    Allergies Aleve [naproxen]  No family history on file.  Social History Social History   Tobacco Use   Smoking status: Never   Smokeless tobacco: Never  Substance Use Topics   Alcohol use: No   Drug use: No    Review of Systems  Review of Systems  Constitutional:  Positive for fatigue. Negative for chills and fever.  HENT:  Positive for sore throat and trouble swallowing (due to pain).   Respiratory:  Negative for shortness of breath.   Cardiovascular:  Negative for chest pain.  Gastrointestinal:  Positive for nausea. Negative for abdominal pain.  Genitourinary:  Negative for flank pain.  Musculoskeletal:  Negative for neck pain.  Skin:  Negative for rash and wound.  Allergic/Immunologic: Negative for immunocompromised state.  Neurological:  Positive for headaches. Negative for weakness and numbness.  Hematological:  Does not bruise/bleed easily.    ____________________________________________  PHYSICAL EXAM:      VITAL SIGNS: ED Triage Vitals  Enc Vitals Group     BP 12/31/20 0734 127/89     Pulse Rate 12/31/20 0734 100     Resp 12/31/20 0734 18     Temp 12/31/20 0734 99 F (37.2 C)     Temp Source 12/31/20 0734 Oral     SpO2 12/31/20 0734 96 %     Weight 12/31/20 0732 210 lb (95.3 kg)     Height 12/31/20 0732  5' (1.524 m)     Head Circumference --      Peak Flow --      Pain Score 12/31/20 0729 10     Pain Loc --      Pain Edu? --      Excl. in GC? --      Physical Exam Vitals and nursing note reviewed.  Constitutional:      General: She is not in acute distress.    Appearance: She is well-developed.  HENT:     Head: Normocephalic and atraumatic.     Comments: Moderate posterior pharyngeal erythema.  No significant tonsillar asymmetry.  Mild swelling noted.  No exudates.  Uvula is midline.  No peritonsillar edema or asymmetry.  No neck stiffness.  No stridor. Eyes:     Conjunctiva/sclera: Conjunctivae normal.  Cardiovascular:     Rate and Rhythm: Normal rate and regular rhythm.     Heart sounds: Normal heart sounds. No murmur heard.   No friction rub.  Pulmonary:     Effort: Pulmonary effort is normal. No respiratory distress.     Breath sounds: Normal breath sounds. No wheezing or rales.     Comments: Lungs CTAB. Normal WOB. Abdominal:     General: There is no distension.     Palpations: Abdomen is soft.     Tenderness: There is no abdominal tenderness.  Musculoskeletal:     Cervical back: Neck supple.  Skin:    General: Skin is warm.     Capillary Refill: Capillary refill takes less than 2 seconds.  Neurological:     Mental Status: She is alert and oriented to person, place, and time.     Motor: No abnormal muscle tone.      ____________________________________________   LABS (all labs ordered are listed, but only abnormal results are displayed)  Labs Reviewed  URINALYSIS, COMPLETE (UACMP) WITH MICROSCOPIC - Abnormal; Notable for the following components:      Result Value   Color, Urine STRAW (*)    APPearance CLEAR (*)    Glucose, UA >=500 (*)    Ketones, ur 5 (*)    All other components within normal limits  CBG MONITORING, ED - Abnormal; Notable for the following components:   Glucose-Capillary 366 (*)    All other components within normal limits  GROUP A  STREP BY PCR  POC URINE PREG, ED    ____________________________________________  EKG:  ________________________________________  RADIOLOGY All imaging, including plain films, CT scans, and ultrasounds, independently reviewed by me, and interpretations confirmed via formal radiology reads.  ED MD interpretation:     Official radiology report(s): No results found.  ____________________________________________  PROCEDURES   Procedure(s) performed (including Critical Care):  Procedures  ____________________________________________  INITIAL IMPRESSION / MDM / ASSESSMENT AND PLAN / ED COURSE  As  part of my medical decision making, I reviewed the following data within the electronic MEDICAL RECORD NUMBER Nursing notes reviewed and incorporated, Old chart reviewed, Notes from prior ED visits, and [place] Controlled Substance Database       *Cheryl Avila was evaluated in Emergency Department on 12/31/2020 for the symptoms described in the history of present illness. She was evaluated in the context of the global COVID-19 pandemic, which necessitated consideration that the patient might be at risk for infection with the SARS-CoV-2 virus that causes COVID-19. Institutional protocols and algorithms that pertain to the evaluation of patients at risk for COVID-19 are in a state of rapid change based on information released by regulatory bodies including the CDC and federal and state organizations. These policies and algorithms were followed during the patient's care in the ED.  Some ED evaluations and interventions may be delayed as a result of limited staffing during the pandemic.*     Medical Decision Making:  25 yo F here with sore throat, pain w/ swallowing. Exam as above. Pt has tonsillar swelling, cervical LAD, no cough, subjective fevers and nausea. Suspect strep or other viral pharyngitis. No signs of PTA, RPA, stridor or airway compromise. No exam evidence of Ludwig's.  Urinalysis  obtained, shows 5 ketones but no significant clinical evidence of DKA with no shortness of breath, tachycardia, tachypnea, and patient is tolerating p.o.  Blood sugar 366 which is not necessarily abnormal for her.  Given absence of clinical evidence of DKA with only 5 ketones on urine despite recently having a GI bug, and subsequently very low suspicion clinically for DKA, I discussed management options with the patient and she is comfortable managing her sugars and hydration at home.  She feels markedly improved with meds here and was given an IM shot of Rocephin.  Will hold on steroids in the setting of her diabetes.  Return precautions given.  ____________________________________________  FINAL CLINICAL IMPRESSION(S) / ED DIAGNOSES  Final diagnoses:  Pharyngitis due to Streptococcus species     MEDICATIONS GIVEN DURING THIS VISIT:  Medications  cefTRIAXone (ROCEPHIN) injection 1 g (has no administration in time range)  oxyCODONE-acetaminophen (PERCOCET/ROXICET) 5-325 MG per tablet 2 tablet (2 tablets Oral Given 12/31/20 0815)  lidocaine (XYLOCAINE) 2 % viscous mouth solution 15 mL (15 mLs Mouth/Throat Given 12/31/20 0815)  ondansetron (ZOFRAN-ODT) disintegrating tablet 4 mg (4 mg Oral Given 12/31/20 0815)     ED Discharge Orders          Ordered    ibuprofen (ADVIL) 600 MG tablet  Every 6 hours PRN        12/31/20 0834    amoxicillin-clavulanate (AUGMENTIN) 875-125 MG tablet  2 times daily        12/31/20 0834    HYDROcodone-acetaminophen (HYCET) 7.5-325 mg/15 ml solution  Every 6 hours PRN        12/31/20 0834             Note:  This document was prepared using Dragon voice recognition software and may include unintentional dictation errors.   Shaune Pollack, MD 12/31/20 [phone]

## 2020-12-31 NOTE — ED Notes (Signed)
See triage note  States she developed sore throat,body aches and fever  Low grade temp on arrival   States she did a home test for COVID which was negative  Cont's to have sore throat

## 2021-02-16 ENCOUNTER — Emergency Department
Admission: EM | Admit: 2021-02-16 | Discharge: 2021-02-16 | Disposition: A | Discharge status: Home / Self Care | Payer: Medicaid Other | Attending: Emergency Medicine | Admitting: Emergency Medicine

## 2021-02-16 ENCOUNTER — Other Ambulatory Visit: Payer: Self-pay

## 2021-02-16 DIAGNOSIS — E1065 Type 1 diabetes mellitus with hyperglycemia: Secondary | ICD-10-CM | POA: Insufficient documentation

## 2021-02-16 DIAGNOSIS — Z794 Long term (current) use of insulin: Secondary | ICD-10-CM | POA: Diagnosis not present

## 2021-02-16 DIAGNOSIS — R112 Nausea with vomiting, unspecified: Secondary | ICD-10-CM | POA: Diagnosis not present

## 2021-02-16 DIAGNOSIS — R109 Unspecified abdominal pain: Secondary | ICD-10-CM | POA: Insufficient documentation

## 2021-02-16 LAB — COMPREHENSIVE METABOLIC PANEL
ALT: 17 U/L (ref 0–44)
AST: 13 U/L — ABNORMAL LOW (ref 15–41)
Albumin: 4.3 g/dL (ref 3.5–5.0)
Alkaline Phosphatase: 70 U/L (ref 38–126)
Anion gap: 13 (ref 5–15)
BUN: 17 mg/dL (ref 6–20)
CO2: 23 mmol/L (ref 22–32)
Calcium: 9.4 mg/dL (ref 8.9–10.3)
Chloride: 99 mmol/L (ref 98–111)
Creatinine, Ser: 0.84 mg/dL (ref 0.44–1.00)
GFR, Estimated: >60 mL/min (ref >60)
Glucose, Bld: 344 mg/dL — ABNORMAL HIGH (ref 70–99)
Potassium: 3.7 mmol/L (ref 3.5–5.1)
Sodium: 135 mmol/L (ref 135–145)
Total Bilirubin: 2.2 mg/dL — ABNORMAL HIGH (ref 0.3–1.2)
Total Protein: 8 g/dL (ref 6.5–8.1)

## 2021-02-16 LAB — CBC
HCT: 39.2 % (ref 36.0–46.0)
Hemoglobin: 14.7 g/dL (ref 12.0–15.0)
MCH: 30.9 pg (ref 26.0–34.0)
MCHC: 37.5 g/dL — ABNORMAL HIGH (ref 30.0–36.0)
MCV: 82.4 fL (ref 80.0–100.0)
Platelets: 342 10*3/uL (ref 150–400)
RBC: 4.76 MIL/uL (ref 3.87–5.11)
RDW: 11.9 % (ref 11.5–15.5)
WBC: 10.5 10*3/uL (ref 4.0–10.5)
nRBC: 0 % (ref 0.0–0.2)

## 2021-02-16 LAB — CBG MONITORING, ED
Glucose-Capillary: 341 mg/dL — ABNORMAL HIGH (ref 70–99)
Glucose-Capillary: 219 mg/dL — ABNORMAL HIGH (ref 70–99)

## 2021-02-16 LAB — BETA-HYDROXYBUTYRIC ACID: Beta-Hydroxybutyric Acid: 2.24 mmol/L — ABNORMAL HIGH (ref 0.05–0.27)

## 2021-02-16 LAB — LIPASE, BLOOD: Lipase: 22 U/L (ref 11–51)

## 2021-02-16 MED ORDER — INSULIN ASPART 100 UNIT/ML IJ SOLN
8.0000 [IU] | Freq: Once | INTRAMUSCULAR | Status: AC
  Administered 2021-02-16: 8 [IU] via INTRAVENOUS
  Filled 2021-02-16: qty 1

## 2021-02-16 MED ORDER — FAMOTIDINE 20 MG PO TABS
20.0000 mg | ORAL_TABLET | Freq: Two times a day (BID) | ORAL | 0 refills | Status: DC
Start: 2021-02-16 — End: 2021-04-26

## 2021-02-16 MED ORDER — ONDANSETRON 4 MG PO TBDP: 4.0000 mg | ORAL_TABLET | Freq: Three times a day (TID) | ORAL | 0 refills | Status: DC | PRN

## 2021-02-16 MED ORDER — ONDANSETRON HCL 4 MG/2ML IJ SOLN
4.0000 mg | Freq: Once | INTRAMUSCULAR | Status: AC
  Administered 2021-02-16: 4 mg via INTRAVENOUS
  Filled 2021-02-16: qty 2

## 2021-02-16 MED ORDER — DIPHENHYDRAMINE HCL 50 MG/ML IJ SOLN
25.0000 mg | Freq: Once | INTRAMUSCULAR | Status: AC
  Administered 2021-02-16: 25 mg via INTRAVENOUS
  Filled 2021-02-16: qty 1

## 2021-02-16 MED ORDER — KETOROLAC TROMETHAMINE 30 MG/ML IJ SOLN
15.0000 mg | INTRAMUSCULAR | Status: AC
  Administered 2021-02-16: 15 mg via INTRAVENOUS
  Filled 2021-02-16: qty 1

## 2021-02-16 MED ORDER — SODIUM CHLORIDE 0.9 % IV BOLUS
1000.0000 mL | Freq: Once | INTRAVENOUS | Status: AC
  Administered 2021-02-16: 1000 mL via INTRAVENOUS

## 2021-02-16 NOTE — ED Provider Notes (Signed)
[facility] Emergency Department Provider Note  ____________________________________________  Time seen: Approximately 12:12 PM  I have reviewed the triage vital signs and the nursing notes.   HISTORY  Chief Complaint Abdominal Pain and Emesis    HPI Cheryl Avila is a 25 y.o. female with a past history of type 1 diabetes on Lantus and NovoLog who comes ED complaining of nausea and vomiting for the past 3 days, unable to eat or drink.  She has stopped taking her insulin due to inability to eat.  Thinks that she probably has a GI bug.  No history of gastroparesis.  No recent trauma or fevers or chills.  Pain is upper abdominal, radiating to the back, 10/10, no aggravating or alleviating factors.  Blood sugar has been in the 300s at home.  Reports 1 previous episode of DKA but not recurrent.    Past Medical History:  Diagnosis Date   Diabetes mellitus without complication [facility])      Patient Active Problem List   Diagnosis Date Noted   [redacted] weeks gestation of pregnancy    Abnormal findings on antenatal screening      Past Surgical History:  Procedure Laterality Date   CESAREAN SECTION     NO PAST SURGERIES       Prior to Admission medications   Medication Sig Start Date End Date Taking? Authorizing Provider  famotidine (PEPCID) 20 MG tablet Take 1 tablet (20 mg total) by mouth 2 (two) times daily. 02/16/21  Yes Sharman Cheek, MD  ondansetron (ZOFRAN ODT) 4 MG disintegrating tablet Take 1 tablet (4 mg total) by mouth every 8 (eight) hours as needed for nausea or vomiting. 02/16/21  Yes Sharman Cheek, MD  dicyclomine (BENTYL) 20 MG tablet Take 1 tablet (20 mg total) by mouth every 8 (eight) hours as needed for up to 7 days for spasms (cramping). 09/14/19 09/21/19  Miguel Aschoff., MD  HYDROcodone-acetaminophen (NORCO/VICODIN) 5-325 MG tablet Take 1 tablet by mouth every 6 (six) hours as needed for severe pain (sore throat, pain). 12/31/20 12/31/21   Shaune Pollack, MD  hydrOXYzine (ATARAX/VISTARIL) 50 MG tablet Take 1 tablet (50 mg total) by mouth 3 (three) times daily as needed for itching. 09/29/19   Joni Reining, PA-C  ibuprofen (ADVIL) 600 MG tablet Take 1 tablet (600 mg total) by mouth every 6 (six) hours as needed for moderate pain or fever. 12/31/20   Shaune Pollack, MD  insulin aspart (NOVOLOG) 100 UNIT/ML FlexPen Inject 0-30 Units into the skin 3 (three) times daily with meals. Sliding scale    [provider]  Insulin Glargine (BASAGLAR KWIKPEN [place]) Inject 38 Units into the skin at bedtime.    [provider]  methylPREDNISolone (MEDROL DOSEPAK) 4 MG TBPK tablet Take Tapered dose as directed 09/29/19   Joni Reining, PA-C     Allergies Aleve [naproxen]   No family history on file.  Social History Social History   Tobacco Use   Smoking status: Never   Smokeless tobacco: Never  Substance Use Topics   Alcohol use: No   Drug use: No    Review of Systems  Constitutional:   No fever or chills.  ENT:   No sore throat. No rhinorrhea. Cardiovascular:   No chest pain or syncope. Respiratory:   No dyspnea or cough. Gastrointestinal:   Positive upper abdominal pain and vomiting. Musculoskeletal:   Negative for focal pain or swelling All other systems reviewed and are negative except as documented above  in ROS and HPI.  ____________________________________________   PHYSICAL EXAM:  VITAL SIGNS: ED Triage Vitals  Enc Vitals Group     BP 02/16/21 1006 135/87     Pulse Rate 02/16/21 1003 (!) 130     Resp 02/16/21 1003 (!) 28     Temp 02/16/21 1011 97.8 F (36.6 C)     Temp Source 02/16/21 1011 Oral     SpO2 02/16/21 1003 98 %     Weight 02/16/21 1005 200 lb (90.7 kg)     Height 02/16/21 1005 5' (1.524 m)     Head Circumference --      Peak Flow --      Pain Score 02/16/21 1004 10     Pain Loc --      Pain Edu? --      Excl. in GC? --     Vital signs reviewed, nursing assessments  reviewed.   Constitutional:   Alert and oriented. Non-toxic appearance. Eyes:   Conjunctivae are normal. EOMI. PERRL. ENT      Head:   Normocephalic and atraumatic.      Nose:   Wearing a mask.      Mouth/Throat:   Wearing a mask.      Neck:   No meningismus. Full ROM. Hematological/Lymphatic/Immunilogical:   No cervical lymphadenopathy. Cardiovascular:   RRR. Symmetric bilateral radial and DP pulses.  No murmurs. Cap refill less than 2 seconds. Respiratory:   Normal respiratory effort without tachypnea/retractions. Breath sounds are clear and equal bilaterally. No wheezes/rales/rhonchi. Gastrointestinal:   Soft with left upper quadrant tenderness. Non distended. There is no CVA tenderness.  No rebound, rigidity, or guarding. Genitourinary:   deferred Musculoskeletal:   Normal range of motion in all extremities. No joint effusions.  No lower extremity tenderness.  No edema. Neurologic:   Normal speech and language.  Motor grossly intact. No acute focal neurologic deficits are appreciated.  Skin:    Skin is warm, dry and intact. No rash noted.  No petechiae, purpura, or bullae.  ____________________________________________    LABS (pertinent positives/negatives) (all labs ordered are listed, but only abnormal results are displayed) Labs Reviewed  COMPREHENSIVE METABOLIC PANEL - Abnormal; Notable for the following components:      Result Value   Glucose, Bld 344 (*)    AST 13 (*)    Total Bilirubin 2.2 (*)    All other components within normal limits  CBC - Abnormal; Notable for the following components:   MCHC 37.5 (*)    All other components within normal limits  CBG MONITORING, ED - Abnormal; Notable for the following components:   Glucose-Capillary 341 (*)    All other components within normal limits  CBG MONITORING, ED - Abnormal; Notable for the following components:   Glucose-Capillary 219 (*)    All other components within normal limits  LIPASE, BLOOD  URINALYSIS,  COMPLETE (UACMP) WITH MICROSCOPIC  BETA-HYDROXYBUTYRIC ACID  POC URINE PREG, ED   ____________________________________________   EKG    ____________________________________________    RADIOLOGY  No results found.  ____________________________________________   PROCEDURES Procedures  ____________________________________________  DIFFERENTIAL DIAGNOSIS   Gastritis, gastroparesis, DKA, dehydration, pancreatitis, biliary disease  CLINICAL IMPRESSION / ASSESSMENT AND PLAN / ED COURSE  Medications ordered in the ED: Medications  sodium chloride 0.9 % bolus 1,000 mL (0 mLs Intravenous Stopped 02/16/21 1112)  ondansetron (ZOFRAN) injection 4 mg (4 mg Intravenous Given 02/16/21 1059)  ketorolac (TORADOL) 30 MG/ML injection 15 mg (15 mg Intravenous Given 02/16/21  1103)  diphenhydrAMINE (BENADRYL) injection 25 mg (25 mg Intravenous Given 02/16/21 1059)  sodium chloride 0.9 % bolus 1,000 mL (0 mLs Intravenous Stopped 02/16/21 1205)  insulin aspart (novoLOG) injection 8 Units (8 Units Intravenous Given 02/16/21 1120)    Pertinent labs & imaging results that were available during my care of the patient were reviewed by me and considered in my medical decision making (see chart for details).  Cheryl Avila was evaluated in Emergency Department on 02/16/2021 for the symptoms described in the history of present illness. She was evaluated in the context of the global COVID-19 pandemic, which necessitated consideration that the patient might be at risk for infection with the SARS-CoV-2 virus that causes COVID-19. Institutional protocols and algorithms that pertain to the evaluation of patients at risk for COVID-19 are in a state of rapid change based on information released by regulatory bodies including the CDC and federal and state organizations. These policies and algorithms were followed during the patient's care in the ED.   Patient presents with upper abdominal pain and vomiting.  She is  tachycardic and appears somewhat dehydrated.  Chemistry panel is unremarkable except for hyperglycemia with a blood sugar of 340.  Anion gap is normal, lipase is normal.  After IV fluids, vital signs are normalized.  She is feeling back to normal, tolerating oral intake and wishes to be discharged without further delay.  Have not yet obtained urinalysis, but she denies acute urinary symptoms and is feeling much better.  We will continue supportive care, recommend bland diet, suspect viral illness.      ____________________________________________   FINAL CLINICAL IMPRESSION(S) / ED DIAGNOSES    Final diagnoses:  Non-intractable vomiting with nausea, unspecified vomiting type  Type 1 diabetes mellitus with hyperglycemia [facility])     ED Discharge Orders          Ordered    ondansetron (ZOFRAN ODT) 4 MG disintegrating tablet  Every 8 hours PRN        02/16/21 1211    famotidine (PEPCID) 20 MG tablet  2 times daily        02/16/21 1211            Portions of this note were generated with dragon dictation software. Dictation errors may occur despite best attempts at proofreading.    Sharman Cheek, MD 02/16/21 1215

## 2021-02-16 NOTE — ED Notes (Addendum)
Pt is type 1 DM having NVD and abdominal pain since 4d. No BM since 2d. Says sugars have been running in 200s, last checked this morning. Pt appears flushed and tachypneic with 10/10 sharp cramping abdominal pain. Dr Scotty Court called in, examining pt.

## 2021-02-16 NOTE — ED Notes (Signed)
CBG 219.  Pt slept. Pt states nausea and abdominal pain have resolved and would like to discharge home.

## 2021-02-16 NOTE — ED Triage Notes (Signed)
First Nurse Note:  Arrives from [facility] for ED evaluation of vomiting x 2 days.

## 2021-02-16 NOTE — ED Notes (Signed)
CBG 341. 

## 2021-02-16 NOTE — ED Notes (Signed)
Looking for second IV. Pt states "IVs make me feel woozy. I need a break". Has 1 IV with bolus infusing.

## 2021-02-16 NOTE — ED Triage Notes (Signed)
Pt states she is a type 1 Diabetic, states she has been having abd pain with N/V/D for the past 3 days. States her glucose has been in the 200's.Marland Kitchen

## 2021-02-16 NOTE — Discharge Instructions (Addendum)
Your test today were all reassuring.  Follow a bland diet and drink lots of fluids to stay hydrated over the next few days until you are feeling back to normal.

## 2021-04-12 DIAGNOSIS — O0991 Supervision of high risk pregnancy, unspecified, first trimester: Secondary | ICD-10-CM | POA: Insufficient documentation

## 2021-04-14 ENCOUNTER — Emergency Department
Admission: EM | Admit: 2021-04-14 | Discharge: 2021-04-14 | Disposition: A | Discharge status: Home / Self Care | Payer: Medicaid Other | Attending: Emergency Medicine | Admitting: Emergency Medicine

## 2021-04-14 ENCOUNTER — Encounter: Payer: Self-pay | Admitting: Emergency Medicine

## 2021-04-14 ENCOUNTER — Other Ambulatory Visit: Payer: Self-pay | Admitting: Obstetrics and Gynecology

## 2021-04-14 ENCOUNTER — Other Ambulatory Visit: Payer: Self-pay

## 2021-04-14 DIAGNOSIS — O219 Vomiting of pregnancy, unspecified: Secondary | ICD-10-CM | POA: Diagnosis not present

## 2021-04-14 DIAGNOSIS — R000 Tachycardia, unspecified: Secondary | ICD-10-CM | POA: Diagnosis not present

## 2021-04-14 DIAGNOSIS — Z794 Long term (current) use of insulin: Secondary | ICD-10-CM | POA: Diagnosis not present

## 2021-04-14 DIAGNOSIS — E119 Type 2 diabetes mellitus without complications: Secondary | ICD-10-CM | POA: Insufficient documentation

## 2021-04-14 DIAGNOSIS — Z3A08 8 weeks gestation of pregnancy: Secondary | ICD-10-CM | POA: Diagnosis not present

## 2021-04-14 LAB — CBC
HCT: 42.6 % (ref 36.0–46.0)
Hemoglobin: 14.9 g/dL (ref 12.0–15.0)
MCH: 30.5 pg (ref 26.0–34.0)
MCHC: 35 g/dL (ref 30.0–36.0)
MCV: 87.1 fL (ref 80.0–100.0)
Platelets: 320 10*3/uL (ref 150–400)
RBC: 4.89 MIL/uL (ref 3.87–5.11)
RDW: 12.5 % (ref 11.5–15.5)
WBC: 11.7 10*3/uL — ABNORMAL HIGH (ref 4.0–10.5)
nRBC: 0 % (ref 0.0–0.2)

## 2021-04-14 LAB — COMPREHENSIVE METABOLIC PANEL
ALT: 17 U/L (ref 0–44)
AST: 13 U/L — ABNORMAL LOW (ref 15–41)
Albumin: 3.9 g/dL (ref 3.5–5.0)
Alkaline Phosphatase: 62 U/L (ref 38–126)
Anion gap: 9 (ref 5–15)
BUN: 12 mg/dL (ref 6–20)
CO2: 23 mmol/L (ref 22–32)
Calcium: 9 mg/dL (ref 8.9–10.3)
Chloride: 101 mmol/L (ref 98–111)
Creatinine, Ser: 0.68 mg/dL (ref 0.44–1.00)
GFR, Estimated: >60 mL/min (ref >60)
Glucose, Bld: 114 mg/dL — ABNORMAL HIGH (ref 70–99)
Potassium: 4.1 mmol/L (ref 3.5–5.1)
Sodium: 133 mmol/L — ABNORMAL LOW (ref 135–145)
Total Bilirubin: 1.3 mg/dL — ABNORMAL HIGH (ref 0.3–1.2)
Total Protein: 7.4 g/dL (ref 6.5–8.1)

## 2021-04-14 LAB — LIPASE, BLOOD: Lipase: 24 U/L (ref 11–51)

## 2021-04-14 MED ORDER — SODIUM CHLORIDE 0.9 % IV BOLUS
1000.0000 mL | Freq: Once | INTRAVENOUS | Status: AC
  Administered 2021-04-14: 1000 mL via INTRAVENOUS

## 2021-04-14 MED ORDER — ONDANSETRON 4 MG PO TBDP: 4.0000 mg | ORAL_TABLET | Freq: Three times a day (TID) | ORAL | 0 refills | Status: DC | PRN

## 2021-04-14 MED ORDER — ONDANSETRON 4 MG PO TBDP
4.0000 mg | ORAL_TABLET | Freq: Once | ORAL | Status: AC | PRN
  Administered 2021-04-14: 4 mg via ORAL
  Filled 2021-04-14: qty 1

## 2021-04-14 NOTE — ED Provider Notes (Signed)
[facility] Emergency Department Provider Note  ____________________________________________   I have reviewed the triage vital signs and the nursing notes.   HISTORY  Chief Complaint Hyperemesis Gravidarum   History limited by: Not Limited   HPI Cheryl Avila is a 25 y.o. female who presents to the emergency department today at roughly [redacted] weeks pregnant.  Because of concerns for dehydration and vomiting.  Patient states that for the past 2 weeks she started developing worsening vomiting.  She has been having a hard time keeping down any solids or liquids.  Because of this she states she is having a hard time regulating her glucose levels.  The patient had her first OB/GYN appointment 2 days ago and was prescribed diclegis however her pharmacy does not currently have that so she has not tried anything for the vomiting.     Records reviewed. Per medical record review patient has a history of DM.  Past Medical History:  Diagnosis Date   Diabetes mellitus without complication [facility])     Patient Active Problem List   Diagnosis Date Noted   [redacted] weeks gestation of pregnancy    Abnormal findings on antenatal screening     Past Surgical History:  Procedure Laterality Date   CESAREAN SECTION     NO PAST SURGERIES      Prior to Admission medications   Medication Sig Start Date End Date Taking? Authorizing Provider  dicyclomine (BENTYL) 20 MG tablet Take 1 tablet (20 mg total) by mouth every 8 (eight) hours as needed for up to 7 days for spasms (cramping). 09/14/19 09/21/19  Miguel Aschoff., MD  famotidine (PEPCID) 20 MG tablet Take 1 tablet (20 mg total) by mouth 2 (two) times daily. 02/16/21   Sharman Cheek, MD  HYDROcodone-acetaminophen (NORCO/VICODIN) 5-325 MG tablet Take 1 tablet by mouth every 6 (six) hours as needed for severe pain (sore throat, pain). 12/31/20 12/31/21  Shaune Pollack, MD  hydrOXYzine (ATARAX/VISTARIL) 50 MG tablet Take 1 tablet (50  mg total) by mouth 3 (three) times daily as needed for itching. 09/29/19   Joni Reining, PA-C  ibuprofen (ADVIL) 600 MG tablet Take 1 tablet (600 mg total) by mouth every 6 (six) hours as needed for moderate pain or fever. 12/31/20   Shaune Pollack, MD  insulin aspart (NOVOLOG) 100 UNIT/ML FlexPen Inject 0-30 Units into the skin 3 (three) times daily with meals. Sliding scale    [provider]  Insulin Glargine (BASAGLAR KWIKPEN [place]) Inject 38 Units into the skin at bedtime.    [provider]  methylPREDNISolone (MEDROL DOSEPAK) 4 MG TBPK tablet Take Tapered dose as directed 09/29/19   Joni Reining, PA-C  ondansetron (ZOFRAN ODT) 4 MG disintegrating tablet Take 1 tablet (4 mg total) by mouth every 8 (eight) hours as needed for nausea or vomiting. 02/16/21   Sharman Cheek, MD    Allergies Aleve [naproxen]  History reviewed. No pertinent family history.  Social History Social History   Tobacco Use   Smoking status: Never   Smokeless tobacco: Never  Substance Use Topics   Alcohol use: No   Drug use: No    Review of Systems Constitutional: No fever/chills Eyes: No visual changes. ENT: No sore throat. Cardiovascular: Denies chest pain. Respiratory: Denies shortness of breath. Gastrointestinal: Positive for nausea and vomiting.  Genitourinary: Negative for dysuria. Musculoskeletal: Negative for back pain. Skin: Negative for rash. Neurological: Negative for headaches, focal weakness or numbness.  ____________________________________________   PHYSICAL EXAM:  VITAL SIGNS: ED Triage Vitals  Enc Vitals Group     BP 04/14/21 1125 125/85     Pulse Rate 04/14/21 1125 75     Resp 04/14/21 1125 18     Temp 04/14/21 1125 98 F (36.7 C)     Temp src --      SpO2 04/14/21 1125 100 %     Weight 04/14/21 1007 200 lb (90.7 kg)     Height 04/14/21 1007 5' (1.524 m)     Head Circumference --      Peak Flow --      Pain Score 04/14/21 1007 0   Constitutional:  Alert and oriented.  Eyes: Conjunctivae are normal.  ENT      Head: Normocephalic and atraumatic.      Nose: No congestion/rhinnorhea.      Mouth/Throat: Mucous membranes are moist.      Neck: No stridor. Hematological/Lymphatic/Immunilogical: No cervical lymphadenopathy. Cardiovascular: Normal rate, regular rhythm.  No murmurs, rubs, or gallops.  Respiratory: Normal respiratory effort without tachypnea nor retractions. Breath sounds are clear and equal bilaterally. No wheezes/rales/rhonchi. Gastrointestinal: Soft and non tender. No rebound. No guarding.  Genitourinary: Deferred Musculoskeletal: Normal range of motion in all extremities. No lower extremity edema. Neurologic:  Normal speech and language. No gross focal neurologic deficits are appreciated.  Skin:  Skin is warm, dry and intact. No rash noted. Psychiatric: Mood and affect are normal. Speech and behavior are normal. Patient exhibits appropriate insight and judgment.  ____________________________________________    LABS (pertinent positives/negatives)  Lipase 24 CBC wbc 11.7, hgb 14.9, plt 320 CMP na 133, k 4.1, glu 114, cr 0.68  ____________________________________________   EKG  None  ____________________________________________    RADIOLOGY  None  ____________________________________________   PROCEDURES  Procedures  ____________________________________________   INITIAL IMPRESSION / ASSESSMENT AND PLAN / ED COURSE  Pertinent labs & imaging results that were available during my care of the patient were reviewed by me and considered in my medical decision making (see chart for details).  Patient presents to the emergency department today because of concern for dehydration in the setting of early pregnancy with frequent vomiting.  Patient is neither tachycardic nor hypotensive here.  Blood work without any concerning electrolyte abnormality or creatinine elevation.  However given reported amount of  vomiting patient was given IV fluids.  She stated she did feel somewhat better.  Will give patient prescription for Zofran given that she has been unable to fill the Diclegis prescription.  ____________________________________________   FINAL CLINICAL IMPRESSION(S) / ED DIAGNOSES  Final diagnoses:  Vomiting pregnancy     Note: This dictation was prepared with Dragon dictation. Any transcriptional errors that result from this process are unintentional     Phineas Semen, MD 04/14/21 1344

## 2021-04-14 NOTE — ED Triage Notes (Signed)
Pt comes into the ED via POV c/o hyperemesis during pregnancy.  Pt is currently [redacted] weeks pregnant and has been vomiting every day and her OB wanted her sent here for IV fluids.  Pt in NAD at this time with even and unlabored respirations.  Pt is type 1 diabetic and states her CBG yesterday was in the 30-40's, but her glucose monitor is currently reading 146.

## 2021-04-14 NOTE — ED Notes (Signed)
See triage note  presents with n/v for couple of days   states she is [redacted] weeks pregnant

## 2021-04-14 NOTE — Discharge Instructions (Signed)
Please seek medical attention for any high fevers, chest pain, shortness of breath, change in behavior, persistent vomiting, bloody stool or any other new or concerning symptoms.  

## 2021-04-22 ENCOUNTER — Encounter: Payer: Self-pay | Admitting: Obstetrics and Gynecology

## 2021-04-22 ENCOUNTER — Inpatient Hospital Stay
Admission: EM | Admit: 2021-04-22 | Discharge: 2021-04-26 | DRG: 831 | Disposition: A | Discharge status: Home / Self Care | Payer: Medicaid Other | Attending: Obstetrics | Admitting: Obstetrics

## 2021-04-22 ENCOUNTER — Other Ambulatory Visit: Payer: Self-pay

## 2021-04-22 DIAGNOSIS — E1065 Type 1 diabetes mellitus with hyperglycemia: Secondary | ICD-10-CM | POA: Diagnosis present

## 2021-04-22 DIAGNOSIS — O211 Hyperemesis gravidarum with metabolic disturbance: Principal | ICD-10-CM | POA: Diagnosis present

## 2021-04-22 DIAGNOSIS — Z9641 Presence of insulin pump (external) (internal): Secondary | ICD-10-CM | POA: Diagnosis present

## 2021-04-22 DIAGNOSIS — E1010 Type 1 diabetes mellitus with ketoacidosis without coma: Secondary | ICD-10-CM | POA: Diagnosis present

## 2021-04-22 DIAGNOSIS — Z20822 Contact with and (suspected) exposure to covid-19: Secondary | ICD-10-CM | POA: Diagnosis present

## 2021-04-22 DIAGNOSIS — Z3A09 9 weeks gestation of pregnancy: Secondary | ICD-10-CM

## 2021-04-22 DIAGNOSIS — O24011 Pre-existing diabetes mellitus, type 1, in pregnancy, first trimester: Secondary | ICD-10-CM | POA: Diagnosis present

## 2021-04-22 DIAGNOSIS — O99810 Abnormal glucose complicating pregnancy: Secondary | ICD-10-CM

## 2021-04-22 DIAGNOSIS — R1110 Vomiting, unspecified: Secondary | ICD-10-CM | POA: Diagnosis present

## 2021-04-22 DIAGNOSIS — O34212 Maternal care for vertical scar from previous cesarean delivery: Secondary | ICD-10-CM | POA: Diagnosis present

## 2021-04-22 DIAGNOSIS — O09211 Supervision of pregnancy with history of pre-term labor, first trimester: Secondary | ICD-10-CM

## 2021-04-22 DIAGNOSIS — O210 Mild hyperemesis gravidarum: Secondary | ICD-10-CM | POA: Diagnosis present

## 2021-04-22 DIAGNOSIS — Z794 Long term (current) use of insulin: Secondary | ICD-10-CM

## 2021-04-22 DIAGNOSIS — O99211 Obesity complicating pregnancy, first trimester: Secondary | ICD-10-CM | POA: Diagnosis present

## 2021-04-22 DIAGNOSIS — E10649 Type 1 diabetes mellitus with hypoglycemia without coma: Secondary | ICD-10-CM | POA: Diagnosis present

## 2021-04-22 DIAGNOSIS — Z3A12 12 weeks gestation of pregnancy: Secondary | ICD-10-CM

## 2021-04-22 LAB — RESP PANEL BY RT-PCR (FLU A&B, COVID) ARPGX2
Influenza A by PCR: NEGATIVE
Influenza B by PCR: NEGATIVE
SARS Coronavirus 2 by RT PCR: NEGATIVE

## 2021-04-22 LAB — CBG MONITORING, ED
Glucose-Capillary: 224 mg/dL — ABNORMAL HIGH (ref 70–99)
Glucose-Capillary: 196 mg/dL — ABNORMAL HIGH (ref 70–99)
Glucose-Capillary: 200 mg/dL — ABNORMAL HIGH (ref 70–99)
Glucose-Capillary: 161 mg/dL — ABNORMAL HIGH (ref 70–99)
Glucose-Capillary: 115 mg/dL — ABNORMAL HIGH (ref 70–99)

## 2021-04-22 LAB — BASIC METABOLIC PANEL
Anion gap: 9 (ref 5–15)
Anion gap: 5 (ref 5–15)
BUN: 15 mg/dL (ref 6–20)
BUN: 13 mg/dL (ref 6–20)
CO2: 17 mmol/L — ABNORMAL LOW (ref 22–32)
CO2: 19 mmol/L — ABNORMAL LOW (ref 22–32)
Calcium: 8.3 mg/dL — ABNORMAL LOW (ref 8.9–10.3)
Calcium: 8.6 mg/dL — ABNORMAL LOW (ref 8.9–10.3)
Chloride: 106 mmol/L (ref 98–111)
Chloride: 108 mmol/L (ref 98–111)
Creatinine, Ser: 0.67 mg/dL (ref 0.44–1.00)
Creatinine, Ser: 0.59 mg/dL (ref 0.44–1.00)
GFR, Estimated: >60 mL/min (ref >60)
GFR, Estimated: >60 mL/min (ref >60)
Glucose, Bld: 216 mg/dL — ABNORMAL HIGH (ref 70–99)
Glucose, Bld: 158 mg/dL — ABNORMAL HIGH (ref 70–99)
Potassium: 4.4 mmol/L (ref 3.5–5.1)
Potassium: 3.7 mmol/L (ref 3.5–5.1)
Sodium: 132 mmol/L — ABNORMAL LOW (ref 135–145)
Sodium: 132 mmol/L — ABNORMAL LOW (ref 135–145)

## 2021-04-22 LAB — CBC WITH DIFFERENTIAL/PLATELET
Abs Immature Granulocytes: 0.07 10*3/uL (ref 0.00–0.07)
Basophils Absolute: 0.1 10*3/uL (ref 0.0–0.1)
Basophils Relative: 0 %
Eosinophils Absolute: 0 10*3/uL (ref 0.0–0.5)
Eosinophils Relative: 0 %
HCT: 41.5 % (ref 36.0–46.0)
Hemoglobin: 14.6 g/dL (ref 12.0–15.0)
Immature Granulocytes: 0 %
Lymphocytes Relative: 8 %
Lymphs Abs: 1.4 10*3/uL (ref 0.7–4.0)
MCH: 31 pg (ref 26.0–34.0)
MCHC: 35.2 g/dL (ref 30.0–36.0)
MCV: 88.1 fL (ref 80.0–100.0)
Monocytes Absolute: 0.4 10*3/uL (ref 0.1–1.0)
Monocytes Relative: 2 %
Neutro Abs: 14.5 10*3/uL — ABNORMAL HIGH (ref 1.7–7.7)
Neutrophils Relative %: 90 %
Platelets: 319 10*3/uL (ref 150–400)
RBC: 4.71 MIL/uL (ref 3.87–5.11)
RDW: 12.2 % (ref 11.5–15.5)
WBC: 16.4 10*3/uL — ABNORMAL HIGH (ref 4.0–10.5)
nRBC: 0 % (ref 0.0–0.2)

## 2021-04-22 LAB — COMPREHENSIVE METABOLIC PANEL
ALT: 21 U/L (ref 0–44)
ALT: 18 U/L (ref 0–44)
AST: 27 U/L (ref 15–41)
AST: 16 U/L (ref 15–41)
Albumin: 4.1 g/dL (ref 3.5–5.0)
Albumin: 3.3 g/dL — ABNORMAL LOW (ref 3.5–5.0)
Alkaline Phosphatase: 68 U/L (ref 38–126)
Alkaline Phosphatase: 54 U/L (ref 38–126)
Anion gap: 12 (ref 5–15)
Anion gap: 7 (ref 5–15)
BUN: 16 mg/dL (ref 6–20)
BUN: 13 mg/dL (ref 6–20)
CO2: 14 mmol/L — ABNORMAL LOW (ref 22–32)
CO2: 18 mmol/L — ABNORMAL LOW (ref 22–32)
Calcium: 9.2 mg/dL (ref 8.9–10.3)
Calcium: 8.3 mg/dL — ABNORMAL LOW (ref 8.9–10.3)
Chloride: 103 mmol/L (ref 98–111)
Chloride: 107 mmol/L (ref 98–111)
Creatinine, Ser: 0.87 mg/dL (ref 0.44–1.00)
Creatinine, Ser: 0.71 mg/dL (ref 0.44–1.00)
GFR, Estimated: >60 mL/min (ref >60)
GFR, Estimated: >60 mL/min (ref >60)
Glucose, Bld: 300 mg/dL — ABNORMAL HIGH (ref 70–99)
Glucose, Bld: 236 mg/dL — ABNORMAL HIGH (ref 70–99)
Potassium: 4.5 mmol/L (ref 3.5–5.1)
Potassium: 3.8 mmol/L (ref 3.5–5.1)
Sodium: 129 mmol/L — ABNORMAL LOW (ref 135–145)
Sodium: 132 mmol/L — ABNORMAL LOW (ref 135–145)
Total Bilirubin: 2.4 mg/dL — ABNORMAL HIGH (ref 0.3–1.2)
Total Bilirubin: 1.6 mg/dL — ABNORMAL HIGH (ref 0.3–1.2)
Total Protein: 8.1 g/dL (ref 6.5–8.1)
Total Protein: 6.5 g/dL (ref 6.5–8.1)

## 2021-04-22 LAB — BLOOD GAS, VENOUS
Acid-base deficit: 9.6 mmol/L — ABNORMAL HIGH (ref 0.0–2.0)
Bicarbonate: 15.9 mmol/L — ABNORMAL LOW (ref 20.0–28.0)
O2 Saturation: 80 %
Patient temperature: 37
pCO2, Ven: 33 mmHg — ABNORMAL LOW (ref 44.0–60.0)
pH, Ven: 7.29 (ref 7.250–7.430)
pO2, Ven: 50 mmHg — ABNORMAL HIGH (ref 32.0–45.0)

## 2021-04-22 LAB — LACTIC ACID, PLASMA
Lactic Acid, Venous: 2.8 mmol/L (ref 0.5–1.9)
Lactic Acid, Venous: 1.5 mmol/L (ref 0.5–1.9)

## 2021-04-22 LAB — BETA-HYDROXYBUTYRIC ACID: Beta-Hydroxybutyric Acid: 1.82 mmol/L — ABNORMAL HIGH (ref 0.05–0.27)

## 2021-04-22 MED ORDER — ONDANSETRON HCL 4 MG/2ML IJ SOLN
4.0000 mg | Freq: Four times a day (QID) | INTRAMUSCULAR | Status: DC
  Administered 2021-04-22 – 2021-04-26 (×15): 4 mg via INTRAVENOUS
  Filled 2021-04-22 (×14): qty 2

## 2021-04-22 MED ORDER — LACTATED RINGERS IV SOLN: INTRAVENOUS | Status: DC

## 2021-04-22 MED ORDER — THIAMINE HCL 100 MG/ML IJ SOLN
Freq: Once | INTRAVENOUS | Status: DC
  Filled 2021-04-22: qty 1000

## 2021-04-22 MED ORDER — DEXTROSE 50 % IV SOLN: 0.0000 mL | INTRAVENOUS | Status: DC | PRN

## 2021-04-22 MED ORDER — POTASSIUM CHLORIDE 10 MEQ/100ML IV SOLN
10.0000 meq | INTRAVENOUS | Status: AC
  Administered 2021-04-22 (×2): 10 meq via INTRAVENOUS

## 2021-04-22 MED ORDER — ONDANSETRON HCL 4 MG/2ML IJ SOLN
4.0000 mg | Freq: Once | INTRAMUSCULAR | Status: AC
  Administered 2021-04-22: 4 mg via INTRAVENOUS
  Filled 2021-04-22: qty 2

## 2021-04-22 MED ORDER — LACTATED RINGERS IV BOLUS: 20.0000 mL/kg | Freq: Once | INTRAVENOUS | Status: DC

## 2021-04-22 MED ORDER — INSULIN REGULAR(HUMAN) IN NACL 100-0.9 UT/100ML-% IV SOLN
INTRAVENOUS | Status: DC
  Administered 2021-04-22: 4.4 [IU]/h via INTRAVENOUS

## 2021-04-22 MED ORDER — LACTATED RINGERS IV BOLUS
1000.0000 mL | Freq: Once | INTRAVENOUS | Status: AC
  Administered 2021-04-22: 1000 mL via INTRAVENOUS

## 2021-04-22 MED ORDER — INSULIN REGULAR(HUMAN) IN NACL 100-0.9 UT/100ML-% IV SOLN
INTRAVENOUS | Status: DC
  Filled 2021-04-22: qty 100

## 2021-04-22 MED ORDER — SODIUM CHLORIDE 0.9 % IV BOLUS
1000.0000 mL | Freq: Once | INTRAVENOUS | Status: AC
  Administered 2021-04-22: 1000 mL via INTRAVENOUS

## 2021-04-22 MED ORDER — DEXTROSE IN LACTATED RINGERS 5 % IV SOLN: INTRAVENOUS | Status: DC

## 2021-04-22 MED ORDER — ACETAMINOPHEN 325 MG PO TABS
650.0000 mg | ORAL_TABLET | ORAL | Status: DC | PRN
  Administered 2021-04-26: 650 mg via ORAL
  Filled 2021-04-22: qty 2

## 2021-04-22 MED ORDER — INSULIN PUMP
Freq: Three times a day (TID) | SUBCUTANEOUS | Status: DC
  Filled 2021-04-22: qty 1

## 2021-04-22 NOTE — ED Triage Notes (Signed)
Pt reports that she was seen last week for similar symptoms,. States that she is [redacted] weeks pregnant and has been vomiting all week, states her blood sugars are in the 400's

## 2021-04-22 NOTE — ED Provider Notes (Signed)
[facility] Emergency Department Provider Note   ____________________________________________   Event Date/Time   First MD Initiated Contact with Patient 04/22/21 1329     (approximate)  I have reviewed the triage vital signs and the nursing notes.   HISTORY  Chief Complaint Emesis and Hyperglycemia   HPI Cheryl Avila is a 25 y.o. female patient is [redacted] weeks pregnant and type I diabetic.  She has been vomiting all week.  Blood sugar was up in the 400s.  She has an insulin pump and has been using it.  She has got her blood pressure down now to 300.  However she reports that she cannot keep anything down.  She has been unable to provide [place] with a urine specimen.  She is not having any pain anywhere.  She is just very nauseated.         Past Medical History:  Diagnosis Date   Diabetes mellitus without complication [facility])     Patient Active Problem List   Diagnosis Date Noted   [redacted] weeks gestation of pregnancy    Abnormal findings on antenatal screening     Past Surgical History:  Procedure Laterality Date   CESAREAN SECTION     NO PAST SURGERIES      Prior to Admission medications   Medication Sig Start Date End Date Taking? Authorizing Provider  dicyclomine (BENTYL) 20 MG tablet Take 1 tablet (20 mg total) by mouth every 8 (eight) hours as needed for up to 7 days for spasms (cramping). 09/14/19 09/21/19  Miguel Aschoff., MD  famotidine (PEPCID) 20 MG tablet Take 1 tablet (20 mg total) by mouth 2 (two) times daily. 02/16/21   Sharman Cheek, MD  HYDROcodone-acetaminophen (NORCO/VICODIN) 5-325 MG tablet Take 1 tablet by mouth every 6 (six) hours as needed for severe pain (sore throat, pain). 12/31/20 12/31/21  Shaune Pollack, MD  hydrOXYzine (ATARAX/VISTARIL) 50 MG tablet Take 1 tablet (50 mg total) by mouth 3 (three) times daily as needed for itching. 09/29/19   Joni Reining, PA-C  ibuprofen (ADVIL) 600 MG tablet Take 1 tablet (600 mg total)  by mouth every 6 (six) hours as needed for moderate pain or fever. 12/31/20   Shaune Pollack, MD  insulin aspart (NOVOLOG) 100 UNIT/ML FlexPen Inject 0-30 Units into the skin 3 (three) times daily with meals. Sliding scale    [provider]  Insulin Glargine (BASAGLAR KWIKPEN [place]) Inject 38 Units into the skin at bedtime.    [provider]  methylPREDNISolone (MEDROL DOSEPAK) 4 MG TBPK tablet Take Tapered dose as directed 09/29/19   Joni Reining, PA-C  ondansetron (ZOFRAN ODT) 4 MG disintegrating tablet Take 1 tablet (4 mg total) by mouth every 8 (eight) hours as needed for nausea or vomiting. 02/16/21   Sharman Cheek, MD  ondansetron (ZOFRAN ODT) 4 MG disintegrating tablet Take 1 tablet (4 mg total) by mouth every 8 (eight) hours as needed for nausea or vomiting. 04/14/21   Phineas Semen, MD    Allergies Aleve [naproxen]  No family history on file.  Social History Social History   Tobacco Use   Smoking status: Never   Smokeless tobacco: Never  Substance Use Topics   Alcohol use: No   Drug use: No    Review of Systems  Constitutional: No fever/chills Eyes: No visual changes. ENT: No sore throat. Cardiovascular: Denies chest pain. Respiratory: Denies shortness of breath. Gastrointestinal: No abdominal pain.  nausea,  vomiting.  No diarrhea.  No constipation. Genitourinary: Negative for dysuria. Musculoskeletal: Negative for back pain. Skin: Negative for rash. Neurological: Negative for headaches, focal weakness  ____________________________________________   PHYSICAL EXAM:  VITAL SIGNS: ED Triage Vitals  Enc Vitals Group     BP 04/22/21 1237 114/79     Pulse Rate 04/22/21 1237 100     Resp 04/22/21 1237 18     Temp 04/22/21 1237 98.4 F (36.9 C)     Temp Source 04/22/21 1237 Oral     SpO2 04/22/21 1237 100 %     Weight 04/22/21 1238 200 lb (90.7 kg)     Height 04/22/21 1238 5' (1.524 m)     Head Circumference --      Peak Flow --       Pain Score 04/22/21 1238 0     Pain Loc --      Pain Edu? --      Excl. in GC? --     Constitutional: Alert and oriented.  Ill-appearing and tired looking Eyes: Conjunctivae are normal.  Head: Atraumatic. Nose: No congestion/rhinnorhea. Mouth/Throat: Mucous membranes are moist.  Oropharynx non-erythematous. Neck: No stridor.   Cardiovascular: Normal rate, regular rhythm. Grossly normal heart sounds.  Good peripheral circulation. Respiratory: Normal respiratory effort.  No retractions. Lungs CTAB. Gastrointestinal: Soft and nontender. No distention. No abdominal bruits.  Musculoskeletal: No lower extremity tenderness nor edema.  Neurologic:  Normal speech and language. No gross focal neurologic deficits are appreciated.  Skin:  Skin is warm, dry and intact. No rash noted.   ____________________________________________   LABS (all labs ordered are listed, but only abnormal results are displayed)  Labs Reviewed  CBC WITH DIFFERENTIAL/PLATELET - Abnormal; Notable for the following components:      Result Value   WBC 16.4 (*)    Neutro Abs 14.5 (*)    All other components within normal limits  COMPREHENSIVE METABOLIC PANEL - Abnormal; Notable for the following components:   Sodium 129 (*)    CO2 14 (*)    Glucose, Bld 300 (*)    Total Bilirubin 2.4 (*)    All other components within normal limits  BLOOD GAS, VENOUS - Abnormal; Notable for the following components:   pCO2, Ven 33 (*)    pO2, Ven 50.0 (*)    Bicarbonate 15.9 (*)    Acid-base deficit 9.6 (*)    All other components within normal limits  LACTIC ACID, PLASMA - Abnormal; Notable for the following components:   Lactic Acid, Venous 2.8 (*)    All other components within normal limits  RESP PANEL BY RT-PCR (FLU A&B, COVID) ARPGX2  LACTIC ACID, PLASMA  URINALYSIS, ROUTINE W REFLEX MICROSCOPIC  BETA-HYDROXYBUTYRIC ACID  CBG MONITORING, ED    ____________________________________________  EKG   ____________________________________________  RADIOLOGY Jill Poling, personally viewed and evaluated these images (plain radiographs) as part of my medical decision making, as well as reviewing the written report by the radiologist.  ED MD interpretation:    Official radiology report(s): No results found.  ____________________________________________   PROCEDURES  Procedure(s) performed (including Critical Care):  Procedures   ____________________________________________   INITIAL IMPRESSION / ASSESSMENT AND PLAN / ED COURSE  Patient's VBG shows decreased pH 7.29.  Her sodium is 129 potassium is good at 4.5 glucose is 300 bicarb is 14 anion gap is 12 her lactic acid is 2.8 white count of 16.4 but no focal signs of infection.  We are waiting for urine specimen at this point.  I have ordered some saline for the patient.  I will talk to Dr. Dalbert Garnet about getting her in the hospital.  Once we give her a liter of saline we will send another med to be and VBG.  Patient is monitoring her blood sugars regularly with her transdermal monitoring system.   ----------------------------------------- 2:12 PM on 04/22/2021 ----------------------------------------- Discussed patient with Dr. Brennan Bailey.  He will begin the process of admitting the patient.          ____________________________________________   FINAL CLINICAL IMPRESSION(S) / ED DIAGNOSES  Final diagnoses:  Hyperemesis gravidarum  Diabetic ketoacidosis without coma associated with type 1 diabetes mellitus [facility])     ED Discharge Orders     None        Note:  This document was prepared using Dragon voice recognition software and may include unintentional dictation errors.    Arnaldo Natal, MD 04/22/21 [phone]

## 2021-04-22 NOTE — ED Notes (Signed)
No d5 solution in orders , messaged dr Logan Bores awaiting response and direction blood sugars under 250

## 2021-04-22 NOTE — ED Notes (Signed)
MD Juanito Doom to patient status/ Endotool concerns; told to page on call OB [facility]

## 2021-04-22 NOTE — ED Notes (Signed)
Pt co IV site pain. IV flushed with good blood return. Pt arm does not appear infiltrated at this time, pt ok to keep iv.

## 2021-04-22 NOTE — Progress Notes (Addendum)
Inpatient Diabetes Program Recommendations  AACE/ADA: New Consensus Statement on Inpatient Glycemic Control (2015)  Target Ranges:  Prepandial:   less than 140 mg/dL      Peak postprandial:   less than 180 mg/dL (1-2 hours)      Critically ill patients:  140 - 180 mg/dL   Lab Results  Component Value Date   GLUCAP 219 (H) 02/16/2021   ADA Standards of Care 2022 Diabetes in Pregnancy Target Glucose Ranges:  Fasting: 60 - 90 mg/dL Preprandial: 60 - 016 mg/dL 1 hr postprandial: Less than 140mg /dL (from first bite of meal) 2 hr postprandial: Less than 120 mg/dL (from first bit of meal)  Review of Glycemic Control Results for GLORY, GRAEFE (MRN Meriel Flavors) as of 04/22/2021 14:25  Ref. Range 04/22/2021 12:51  Glucose Latest Ref Range: 70 - 99 mg/dL 13/04/2021 (H)   Diabetes history: DM type 1 Outpatient Diabetes medications:  She is on the T slim pump.  Basal rates MN=2.4 6am=2.5 10am=2.4 5pm=2.5 10pm=2.4 TDD basal: 58.5  Bolus settings I/C: 8 ISF: 20 Target Glucose: 110 Active insulin time: 5 Current orders for Inpatient glycemic control:  Insulin pump/   Inpatient Diabetes Program Recommendations:    Note elevated blood sugar and low CO2.  Patient has Hyperemesis with pregnancy.  Patient would likely benefit from IV insulin due to low CO2?  Consider checking beta hydroxybutyric acid to determine if patient producing ketones.  ? Starvation ketosis.   Thanks,  732, RN, BC-ADM Inpatient Diabetes Coordinator Pager [phone]   [phone].  Note IV insulin orders placed in ED.  Recommend DM and Pregnancy order set for IV insulin/DKA orders.  Will need to remove insulin pump prior to IV insulin being started.

## 2021-04-22 NOTE — ED Notes (Signed)
Haroldine Laws messaged concerning Endotool to Subcutaneous transition; told "not tonight"; ED Charge RN Loraine Grip notified

## 2021-04-22 NOTE — ED Notes (Signed)
NP Cheryl Avila messaged with request to transition patient off Endotool

## 2021-04-22 NOTE — Progress Notes (Addendum)
Spoke briefly with RN in the ED regarding patient.  CBG is down to 216 mg/dL without IV insulin.  Patient is currently wearing insulin pump and labs pending.   Patient to transfer to MB unit, however they cannot take IV insulin.   Called Haroldine Laws, CNM to discuss.  Recommend use of diabetes and pregnancy order set --> IV insulin/DKA.  Patient will need to remove insulin pump if IV insulin started.  Once acidosis is cleared, patient can transition back to insulin pump (will need to overlap insulin pump and IV insulin by one hour).   Thanks,  Beryl Meager, RN, BC-ADM Inpatient Diabetes Coordinator Pager [phone]  (8a-5p)

## 2021-04-22 NOTE — ED Notes (Signed)
John RN aware of assigned bed 

## 2021-04-23 ENCOUNTER — Encounter: Payer: Self-pay | Admitting: Obstetrics and Gynecology

## 2021-04-23 DIAGNOSIS — O99211 Obesity complicating pregnancy, first trimester: Secondary | ICD-10-CM | POA: Diagnosis present

## 2021-04-23 DIAGNOSIS — O210 Mild hyperemesis gravidarum: Secondary | ICD-10-CM | POA: Diagnosis present

## 2021-04-23 DIAGNOSIS — Z794 Long term (current) use of insulin: Secondary | ICD-10-CM | POA: Diagnosis not present

## 2021-04-23 DIAGNOSIS — O24011 Pre-existing diabetes mellitus, type 1, in pregnancy, first trimester: Secondary | ICD-10-CM | POA: Diagnosis present

## 2021-04-23 DIAGNOSIS — O34212 Maternal care for vertical scar from previous cesarean delivery: Secondary | ICD-10-CM | POA: Diagnosis present

## 2021-04-23 DIAGNOSIS — E10649 Type 1 diabetes mellitus with hypoglycemia without coma: Secondary | ICD-10-CM | POA: Diagnosis present

## 2021-04-23 DIAGNOSIS — E1065 Type 1 diabetes mellitus with hyperglycemia: Secondary | ICD-10-CM

## 2021-04-23 DIAGNOSIS — Z9641 Presence of insulin pump (external) (internal): Secondary | ICD-10-CM | POA: Diagnosis present

## 2021-04-23 DIAGNOSIS — Z3A09 9 weeks gestation of pregnancy: Secondary | ICD-10-CM | POA: Diagnosis not present

## 2021-04-23 DIAGNOSIS — R1110 Vomiting, unspecified: Secondary | ICD-10-CM | POA: Diagnosis present

## 2021-04-23 DIAGNOSIS — O09211 Supervision of pregnancy with history of pre-term labor, first trimester: Secondary | ICD-10-CM | POA: Diagnosis not present

## 2021-04-23 DIAGNOSIS — E1010 Type 1 diabetes mellitus with ketoacidosis without coma: Secondary | ICD-10-CM | POA: Diagnosis present

## 2021-04-23 DIAGNOSIS — Z20822 Contact with and (suspected) exposure to covid-19: Secondary | ICD-10-CM | POA: Diagnosis present

## 2021-04-23 DIAGNOSIS — O211 Hyperemesis gravidarum with metabolic disturbance: Secondary | ICD-10-CM | POA: Diagnosis not present

## 2021-04-23 LAB — BASIC METABOLIC PANEL
Anion gap: 6 (ref 5–15)
Anion gap: 7 (ref 5–15)
Anion gap: 5 (ref 5–15)
BUN: 12 mg/dL (ref 6–20)
BUN: 12 mg/dL (ref 6–20)
BUN: 9 mg/dL (ref 6–20)
CO2: 20 mmol/L — ABNORMAL LOW (ref 22–32)
CO2: 21 mmol/L — ABNORMAL LOW (ref 22–32)
CO2: 20 mmol/L — ABNORMAL LOW (ref 22–32)
Calcium: 8.1 mg/dL — ABNORMAL LOW (ref 8.9–10.3)
Calcium: 8.3 mg/dL — ABNORMAL LOW (ref 8.9–10.3)
Calcium: 7.7 mg/dL — ABNORMAL LOW (ref 8.9–10.3)
Chloride: 106 mmol/L (ref 98–111)
Chloride: 105 mmol/L (ref 98–111)
Chloride: 105 mmol/L (ref 98–111)
Creatinine, Ser: 0.63 mg/dL (ref 0.44–1.00)
Creatinine, Ser: 0.8 mg/dL (ref 0.44–1.00)
Creatinine, Ser: 0.57 mg/dL (ref 0.44–1.00)
GFR, Estimated: >60 mL/min (ref >60)
GFR, Estimated: >60 mL/min (ref >60)
GFR, Estimated: >60 mL/min (ref >60)
Glucose, Bld: 143 mg/dL — ABNORMAL HIGH (ref 70–99)
Glucose, Bld: 162 mg/dL — ABNORMAL HIGH (ref 70–99)
Glucose, Bld: 235 mg/dL — ABNORMAL HIGH (ref 70–99)
Potassium: 4 mmol/L (ref 3.5–5.1)
Potassium: 3.6 mmol/L (ref 3.5–5.1)
Potassium: 3.6 mmol/L (ref 3.5–5.1)
Sodium: 132 mmol/L — ABNORMAL LOW (ref 135–145)
Sodium: 133 mmol/L — ABNORMAL LOW (ref 135–145)
Sodium: 130 mmol/L — ABNORMAL LOW (ref 135–145)

## 2021-04-23 LAB — URINALYSIS, ROUTINE W REFLEX MICROSCOPIC
Bilirubin Urine: NEGATIVE
Glucose, UA: >=500 mg/dL — AB
Hgb urine dipstick: NEGATIVE
Ketones, ur: 80 mg/dL — AB
Leukocytes,Ua: NEGATIVE
Nitrite: NEGATIVE
Protein, ur: NEGATIVE mg/dL
Specific Gravity, Urine: 1.023 (ref 1.005–1.030)
pH: 5 (ref 5.0–8.0)

## 2021-04-23 LAB — CBG MONITORING, ED
Glucose-Capillary: 108 mg/dL — ABNORMAL HIGH (ref 70–99)
Glucose-Capillary: 127 mg/dL — ABNORMAL HIGH (ref 70–99)
Glucose-Capillary: 149 mg/dL — ABNORMAL HIGH (ref 70–99)
Glucose-Capillary: 180 mg/dL — ABNORMAL HIGH (ref 70–99)
Glucose-Capillary: 184 mg/dL — ABNORMAL HIGH (ref 70–99)
Glucose-Capillary: 190 mg/dL — ABNORMAL HIGH (ref 70–99)

## 2021-04-23 LAB — CBC
HCT: 37 % (ref 36.0–46.0)
Hemoglobin: 12.9 g/dL (ref 12.0–15.0)
MCH: 30.2 pg (ref 26.0–34.0)
MCHC: 34.9 g/dL (ref 30.0–36.0)
MCV: 86.7 fL (ref 80.0–100.0)
Platelets: 158 10*3/uL (ref 150–400)
RBC: 4.27 MIL/uL (ref 3.87–5.11)
RDW: 12.5 % (ref 11.5–15.5)
WBC: 10.9 10*3/uL — ABNORMAL HIGH (ref 4.0–10.5)
nRBC: 0 % (ref 0.0–0.2)

## 2021-04-23 LAB — BETA-HYDROXYBUTYRIC ACID
Beta-Hydroxybutyric Acid: 1.53 mmol/L — ABNORMAL HIGH (ref 0.05–0.27)
Beta-Hydroxybutyric Acid: 1 mmol/L — ABNORMAL HIGH (ref 0.05–0.27)
Beta-Hydroxybutyric Acid: 1.95 mmol/L — ABNORMAL HIGH (ref 0.05–0.27)

## 2021-04-23 LAB — LACTIC ACID, PLASMA: Lactic Acid, Venous: 1.8 mmol/L (ref 0.5–1.9)

## 2021-04-23 LAB — GLUCOSE, CAPILLARY
Glucose-Capillary: 188 mg/dL — ABNORMAL HIGH (ref 70–99)
Glucose-Capillary: 165 mg/dL — ABNORMAL HIGH (ref 70–99)

## 2021-04-23 MED ORDER — METOCLOPRAMIDE HCL 10 MG PO TABS
10.0000 mg | ORAL_TABLET | Freq: Four times a day (QID) | ORAL | Status: DC
  Administered 2021-04-25 – 2021-04-26 (×7): 10 mg via ORAL
  Filled 2021-04-23 (×15): qty 1

## 2021-04-23 MED ORDER — HYDROXYZINE HCL 50 MG/ML IM SOLN
50.0000 mg | Freq: Four times a day (QID) | INTRAMUSCULAR | Status: DC | PRN
  Filled 2021-04-23: qty 1

## 2021-04-23 MED ORDER — SODIUM CHLORIDE 0.9 % IV BOLUS
500.0000 mL | Freq: Once | INTRAVENOUS | Status: AC
  Administered 2021-04-23: 500 mL via INTRAVENOUS

## 2021-04-23 MED ORDER — INSULIN DETEMIR 100 UNIT/ML ~~LOC~~ SOLN
10.0000 [IU] | Freq: Every day | SUBCUTANEOUS | Status: DC
  Administered 2021-04-23 – 2021-04-24 (×2): 10 [IU] via SUBCUTANEOUS
  Filled 2021-04-23 (×2): qty 0.1

## 2021-04-23 MED ORDER — PROMETHAZINE HCL 25 MG RE SUPP: 12.5000 mg | RECTAL | Status: DC | PRN

## 2021-04-23 MED ORDER — SODIUM CHLORIDE 0.9 % IV SOLN
12.5000 mg | Freq: Four times a day (QID) | INTRAVENOUS | Status: DC | PRN
  Administered 2021-04-23 – 2021-04-24 (×4): 12.5 mg via INTRAVENOUS
  Filled 2021-04-23 (×4): qty 0.5
  Filled 2021-04-23: qty 12.5
  Filled 2021-04-23: qty 0.5

## 2021-04-23 MED ORDER — PROMETHAZINE HCL 25 MG PO TABS
12.5000 mg | ORAL_TABLET | ORAL | Status: DC | PRN
  Filled 2021-04-23: qty 1

## 2021-04-23 MED ORDER — INSULIN ASPART 100 UNIT/ML IJ SOLN: 0.0000 [IU] | Freq: Three times a day (TID) | INTRAMUSCULAR | Status: DC

## 2021-04-23 MED ORDER — FAMOTIDINE 20 MG PO TABS
20.0000 mg | ORAL_TABLET | Freq: Two times a day (BID) | ORAL | Status: DC
  Administered 2021-04-25 – 2021-04-26 (×3): 20 mg via ORAL
  Filled 2021-04-23 (×3): qty 1

## 2021-04-23 MED ORDER — METOCLOPRAMIDE HCL 5 MG/ML IJ SOLN
10.0000 mg | Freq: Four times a day (QID) | INTRAMUSCULAR | Status: DC
  Administered 2021-04-23 – 2021-04-24 (×5): 10 mg via INTRAVENOUS
  Filled 2021-04-23 (×7): qty 2

## 2021-04-23 MED ORDER — INSULIN ASPART 100 UNIT/ML IJ SOLN
0.0000 [IU] | Freq: Three times a day (TID) | INTRAMUSCULAR | Status: DC
  Administered 2021-04-23: 4 [IU] via SUBCUTANEOUS
  Administered 2021-04-24: 8 [IU] via SUBCUTANEOUS
  Filled 2021-04-23 (×2): qty 1

## 2021-04-23 MED ORDER — FAMOTIDINE IN NACL 20-0.9 MG/50ML-% IV SOLN
20.0000 mg | Freq: Two times a day (BID) | INTRAVENOUS | Status: DC
  Administered 2021-04-23 – 2021-04-24 (×3): 20 mg via INTRAVENOUS
  Filled 2021-04-23 (×8): qty 50

## 2021-04-23 MED ORDER — PROMETHAZINE HCL 25 MG PO TABS
12.5000 mg | ORAL_TABLET | Freq: Four times a day (QID) | ORAL | Status: DC | PRN
Start: 2021-04-23 — End: 2021-04-26
  Administered 2021-04-25 (×2): 25 mg via ORAL
  Filled 2021-04-23 (×3): qty 1

## 2021-04-23 MED ORDER — PROMETHAZINE HCL 25 MG PO TABS: 12.5000 mg | ORAL_TABLET | Freq: Four times a day (QID) | ORAL | Status: DC | PRN

## 2021-04-23 MED ORDER — HYDROXYZINE HCL 25 MG PO TABS
50.0000 mg | ORAL_TABLET | Freq: Four times a day (QID) | ORAL | Status: DC | PRN
  Administered 2021-04-24: 50 mg via ORAL
  Filled 2021-04-23: qty 2

## 2021-04-23 MED ORDER — SODIUM CHLORIDE 0.9 % IV SOLN: 25.0000 mg | Freq: Four times a day (QID) | INTRAVENOUS | Status: DC | PRN

## 2021-04-23 NOTE — Progress Notes (Signed)
ADA Standards of Care 2022 Diabetes in Pregnancy Target Glucose Ranges:   Fasting: 60 - 90 mg/dL Preprandial: 60 - 449 mg/dL 1 hr postprandial: Less than 140mg /dL (from first bite of meal) 2 hr postprandial: Less than 120 mg/dL (from first bit of meal)    Results for MILLI, WOOLRIDGE (MRN Meriel Flavors) as of 04/23/2021 09:01  Ref. Range 04/23/2021 05:21  Beta-Hydroxybutyric Acid Latest Ref Range: 0.05 - 0.27 mmol/L 1.00 (H)  Results for KAYLON, LAROCHE (MRN Meriel Flavors) as of 04/23/2021 09:01  Ref. Range 04/23/2021 00:19 04/23/2021 02:02 04/23/2021 03:51 04/23/2021 06:06 04/23/2021 07:26 04/23/2021 08:58  Glucose-Capillary Latest Ref Range: 70 - 99 mg/dL 13/05/2021 (H) 177 (H) 939 (H) 190 (H) 184 (H) 127 (H)    Admit Emesis and Hyperglycemia [redacted] weeks Pregnant  History of Type 1 Diabetes  Home DM Meds: T slim pump Insulin Pump Basal rates MN=2.4 6am=2.5 10am=2.4 5pm=2.5 10pm=2.4 TDD basal: 58.5  Bolus settings I/C: 8 ISF: 20 Target Glucose: 110 Active insulin time: 5  Current Orders: IV Insulin Drip using Endotool  Pregnancy parameters of 90-120 mg/dl D5 LR IVF running 030    MD- Would leave pt on the IV Insulin Drip until her CBGs stabilize around the 90-120 range for several hours and until nausea and vomiting has significantly improved  Note BMET shows improvement this AM Glucose 162 Anion Gap 7 CO2 level 21 However, Beta-hydroxybutyric acid level was still 1 at 5am today  When you allow pt to transition back to her home insulin pump, it would be safest for her to use all new supplies (husband can bring if she does not have any at bedside) and Make sure pt resumes the insulin pump for at least 1 full hour prior to stopping the IV Insulin Drip.    --Will follow patient during hospitalization--  092ZR/AQ RN, MSN, CDE Diabetes Coordinator Inpatient Glycemic Control Team Team Pager: [phone] (8a-5p)

## 2021-04-23 NOTE — Consult Note (Signed)
[facility] Medical Consultation  Cheryl Avila JOI:[id number] DOB: 02/17/1996 DOA: 04/22/2021 PCP: [facility]   Requesting physician: Dr Logan Bores Date of consultation: 04/23/21 Reason for consultation: Management of hyperglycemia  Impression/Recommendations Principal Problem:   Type 1 diabetes mellitus with hyperglycemia (HCC) Active Problems:   [redacted] weeks gestation of pregnancy   Hyperemesis    Type 1 diabetes mellitus with DKA Patient has a history of type 1 diabetes mellitus and presents to the ER for evaluation of refractory nausea and vomiting secondary to hyperemesis gravidarum.  She was noted to be in DKA on admission and was placed on an insulin drip.  Patient's anion gap has closed and her blood sugars have improved.  Nausea and vomiting have also improved and she is able to tolerate oral intake. Will administer Levemir 10 units and discontinue insulin drip in 1 hour. Start patient on a clear liquid diet and advance as tolerated Will place patient on a sliding scale coverage until her oral intake has improved.   2.  Intrauterine pregnancy Management per obstetrician  I will followup again tomorrow. Please contact me if I can be of assistance in the meanwhile. Thank you for this consultation.  Chief Complaint: Nausea/Vomiting  HPI:  Patient is a 25 year old female with a history of type 1 diabetes mellitus.  She is currently about [redacted] weeks pregnant.  She presents to the ER for evaluation of a 1 week history of nausea and persistent emesis with associated hyperglycemia.  Patient was noted to have blood sugar in the 400s despite using her insulin pump.  She has been unable to tolerate any oral intake.  She was acidotic upon arrival to the ER and due to her persistent nausea and vomiting was started on insulin drip. During evaluation this morning her symptoms have improved and she has been able to tolerate some graham crackers and water.  She has been on an  insulin drip since last night. She denies having any fever, no chills, no cough, no abdominal pain, no urinary symptoms, no changes in her bowel habits, no chest pain, no shortness of breath, no dizziness, no lightheadedness, no headache, no blurred vision no focal deficit. Labs show sodium 133, potassium 3.6, chloride 104, bicarb 21, glucose 162, BUN 12, creatinine 0.80, calcium 8.3, anion gap 7, beta hydroxy 1.0 Respiratory viral panel is negative Review of Systems:  As per HPI otherwise all other systems reviewed and negative.   Past Medical History:  Diagnosis Date   Diabetes mellitus without complication (HCC)    Past Surgical History:  Procedure Laterality Date   CESAREAN SECTION     NO PAST SURGERIES     Social History:  reports that she has never smoked. She has never used smokeless tobacco. She reports that she does not drink alcohol and does not use drugs.  Allergies  Allergen Reactions   Aleve [Naproxen] Hives   History reviewed. No pertinent family history.  Prior to Admission medications   Medication Sig Start Date End Date Taking? Authorizing Provider  insulin aspart (NOVOLOG) 100 UNIT/ML FlexPen Inject 0-100 Units into the skin as directed. Via insulin pump   Yes [provider]  ondansetron (ZOFRAN ODT) 4 MG disintegrating tablet Take 1 tablet (4 mg total) by mouth every 8 (eight) hours as needed for nausea or vomiting. 04/14/21  Yes Phineas Semen, MD  Prenatal 28-0.8 MG TABS Take 1 tablet by mouth daily.   Yes [provider]  famotidine (PEPCID) 20 MG tablet Take 1  tablet (20 mg total) by mouth 2 (two) times daily. Patient not taking: Reported on 04/22/2021 02/16/21   Sharman Cheek, MD  HYDROcodone-acetaminophen (NORCO/VICODIN) 5-325 MG tablet Take 1 tablet by mouth every 6 (six) hours as needed for severe pain (sore throat, pain). Patient not taking: No sig reported 12/31/20 12/31/21  Shaune Pollack, MD  ibuprofen (ADVIL) 600 MG tablet Take 1  tablet (600 mg total) by mouth every 6 (six) hours as needed for moderate pain or fever. Patient not taking: No sig reported 12/31/20   Shaune Pollack, MD   Physical Exam: Blood pressure 105/70, pulse 95, temperature 98.1 F (36.7 C), temperature source Oral, resp. rate 17, height 5' (1.524 m), weight 90.7 kg, last menstrual period 01/27/2021, SpO2 98 %, currently breastfeeding. Vitals:   04/23/21 0734 04/23/21 1040  BP: (!) 104/59 105/70  Pulse: 98 95  Resp: 16 17  Temp:  98.1 F (36.7 C)  SpO2: 100% 98%    General: Patient is sitting up in bed and appears comfortable and in no distress. Obese Eyes: Pale conjunctiva ENT: Within normal limits Neck: Supple, no JVD Cardiovascular: RRR S1,S2 Respiratory: Clear to auscultation bilaterally Abdomen: Bowel sounds are present, soft, nondistended Skin: Warm and dry Musculoskeletal: Normal range of motion Psychiatric: Normal mood and affect Neurologic: Able to move all extremities  Labs on Admission:  Basic Metabolic Panel: Recent Labs  Lab 04/22/21 1510 04/22/21 1750 04/22/21 2004 04/23/21 0020 04/23/21 0521  NA 132* 132* 132* 132* 133*  K 4.4 3.8 3.7 4.0 3.6  CL 106 107 108 106 105  CO2 17* 18* 19* 20* 21*  GLUCOSE 216* 236* 158* 143* 162*  BUN 15 13 13 12 12   CREATININE 0.67 0.71 0.59 0.63 0.80  CALCIUM 8.3* 8.3* 8.6* 8.1* 8.3*   Liver Function Tests: Recent Labs  Lab 04/22/21 1251 04/22/21 1750  AST 27 16  ALT 21 18  ALKPHOS 68 54  BILITOT 2.4* 1.6*  PROT 8.1 6.5  ALBUMIN 4.1 3.3*   No results for input(s): LIPASE, AMYLASE in the last 168 hours. No results for input(s): AMMONIA in the last 168 hours. CBC: Recent Labs  Lab 04/22/21 1251  WBC 16.4*  NEUTROABS 14.5*  HGB 14.6  HCT 41.5  MCV 88.1  PLT 319   Cardiac Enzymes: No results for input(s): CKTOTAL, CKMB, CKMBINDEX, TROPONINI in the last 168 hours. BNP: Invalid input(s): POCBNP CBG: Recent Labs  Lab 04/23/21 0202 04/23/21 0351  04/23/21 0606 04/23/21 0726 04/23/21 0858  GLUCAP 180* 108* 190* 184* 127*    Radiological Exams on Admission: No results found.  EKG: Independently reviewed.   Time spent: 32  Dael Howland [facility] Pager [phone]  If 7PM-7AM, please contact night-coverage [email or web address] Password [facility] 04/23/2021, 10:49 AM

## 2021-04-23 NOTE — Progress Notes (Signed)
ANTEPARTUM PROGRESS NOTE  Cheryl Avila is a 25 y.o. [id number] at [redacted]w[redacted]d who is admitted for severe n/v in pregnancy and DKA.   Estimated Date of Delivery: 11/25/21  Length of Stay:  0 Days. Admitted 04/22/2021  Subjective: -Able to tolerate clear liquids   She reports:  -no vaginal bleeding -no cramping   Vitals:  BP 136/83 (BP Location: Left Arm)   Pulse 81   Temp 98.6 F (37 C) (Oral)   Resp 16   Ht 5' (1.524 m)   Wt 90.7 kg   LMP 01/27/2021 (Approximate)   SpO2 100%   BMI 39.06 kg/m  Physical Examination: General:   alert, cooperative, and no distress  Skin:  normal  Neurologic:    Alert & oriented x 3  Lungs:    No use of accessory muscles   Heart:   regular rate and rhythm  Abdomen:  soft, non-tender; bowel sounds normal; no masses,  no organomegaly  Pelvis:  Exam deferred.  Extremities: : non-tender, symmetric, No edema bilaterally.  DTRs: 2+/2+     Results for orders placed or performed during the hospital encounter of 04/22/21 (from the past 48 hour(s))  CBC with Differential     Status: Abnormal   Collection Time: 04/22/21 12:51 PM  Result Value Ref Range   WBC 16.4 (H) 4.0 - 10.5 K/uL   RBC 4.71 3.87 - 5.11 MIL/uL   Hemoglobin 14.6 12.0 - 15.0 g/dL   HCT 71.2 45.8 - 09.9 %   MCV 88.1 80.0 - 100.0 fL   MCH 31.0 26.0 - 34.0 pg   MCHC 35.2 30.0 - 36.0 g/dL   RDW 83.3 82.5 - 05.3 %   Platelets 319 150 - 400 K/uL   nRBC 0.0 0.0 - 0.2 %   Neutrophils Relative % 90 %   Neutro Abs 14.5 (H) 1.7 - 7.7 K/uL   Lymphocytes Relative 8 %   Lymphs Abs 1.4 0.7 - 4.0 K/uL   Monocytes Relative 2 %   Monocytes Absolute 0.4 0.1 - 1.0 K/uL   Eosinophils Relative 0 %   Eosinophils Absolute 0.0 0.0 - 0.5 K/uL   Basophils Relative 0 %   Basophils Absolute 0.1 0.0 - 0.1 K/uL   Immature Granulocytes 0 %   Abs Immature Granulocytes 0.07 0.00 - 0.07 K/uL    Comment: Performed at [facility], [address] [place], [place] [address]  Comprehensive metabolic  panel     Status: Abnormal   Collection Time: 04/22/21 12:51 PM  Result Value Ref Range   Sodium 129 (L) 135 - 145 mmol/L   Potassium 4.5 3.5 - 5.1 mmol/L    Comment: HEMOLYSIS AT THIS LEVEL MAY AFFECT RESULT   Chloride 103 98 - 111 mmol/L   CO2 14 (L) 22 - 32 mmol/L   Glucose, Bld 300 (H) 70 - 99 mg/dL    Comment: Glucose reference range applies only to samples taken after fasting for at least 8 hours.   BUN 16 6 - 20 mg/dL   Creatinine, Ser 4.19 0.44 - 1.00 mg/dL   Calcium 9.2 8.9 - 37.9 mg/dL   Total Protein 8.1 6.5 - 8.1 g/dL   Albumin 4.1 3.5 - 5.0 g/dL   AST 27 15 - 41 U/L   ALT 21 0 - 44 U/L   Alkaline Phosphatase 68 38 - 126 U/L   Total Bilirubin 2.4 (H) 0.3 - 1.2 mg/dL   GFR, Estimated >02 >40 mL/min    Comment: (NOTE)  Calculated using the CKD-EPI Creatinine Equation (2021)    Anion gap 12 5 - 15    Comment: Performed at [facility], [address] [place], [place] [address]  Blood gas, venous     Status: Abnormal   Collection Time: 04/22/21 12:51 PM  Result Value Ref Range   pH, Ven 7.29 7.250 - 7.430   pCO2, Ven 33 (L) 44.0 - 60.0 mmHg   pO2, Ven 50.0 (H) 32.0 - 45.0 mmHg   Bicarbonate 15.9 (L) 20.0 - 28.0 mmol/L   Acid-base deficit 9.6 (H) 0.0 - 2.0 mmol/L   O2 Saturation 80.0 %   Patient temperature 37.0    Collection site VEIN    Sample type VEIN     Comment: Performed at [facility], [address] [place], [place] [address]  Lactic acid, plasma     Status: Abnormal   Collection Time: 04/22/21 12:51 PM  Result Value Ref Range   Lactic Acid, Venous 2.8 (HH) 0.5 - 1.9 mmol/L    Comment: CRITICAL RESULT CALLED TO, READ BACK BY AND VERIFIED WITH COLLYN GILLESPIE 04/22/21 1316 MW Performed at [facility], [address] [place], [place] [address]   Beta-hydroxybutyric acid     Status: Abnormal   Collection Time: 04/22/21  2:00 PM  Result Value Ref Range   Beta-Hydroxybutyric Acid 1.82 (H) 0.05 - 0.27 mmol/L    Comment:  Performed at [facility], [address] [place], [place] [address]  Resp Panel by RT-PCR (Flu A&B, Covid) Nasopharyngeal Swab     Status: None   Collection Time: 04/22/21  2:13 PM   Specimen: Nasopharyngeal Swab; Nasopharyngeal(NP) swabs in vial transport medium  Result Value Ref Range   SARS Coronavirus 2 by RT PCR NEGATIVE NEGATIVE    Comment: (NOTE) SARS-CoV-2 target nucleic acids are NOT DETECTED.  The SARS-CoV-2 RNA is generally detectable in upper respiratory specimens during the acute phase of infection. The lowest concentration of SARS-CoV-2 viral copies this assay can detect is 138 copies/mL. A negative result does not preclude SARS-Cov-2 infection and should not be used as the sole basis for treatment or other patient management decisions. A negative result may occur with  improper specimen collection/handling, submission of specimen other than nasopharyngeal swab, presence of viral mutation(s) within the areas targeted by this assay, and inadequate number of viral copies(<138 copies/mL). A negative result must be combined with clinical observations, patient history, and epidemiological information. The expected result is Negative.  Fact Sheet for Patients:  BloggerCourse.com  Fact Sheet for Healthcare Providers:  SeriousBroker.it  This test is no t yet approved or cleared by the [place] FDA and  has been authorized for detection and/or diagnosis of SARS-CoV-2 by FDA under an Emergency Use Authorization (EUA). This EUA will remain  in effect (meaning this test can be used) for the duration of the COVID-19 declaration under Section 564(b)(1) of the Act, 21 U.S.C.section 360bbb-3(b)(1), unless the authorization is terminated  or revoked sooner.       Influenza A by PCR NEGATIVE NEGATIVE   Influenza B by PCR NEGATIVE NEGATIVE    Comment: (NOTE) The Xpert Xpress SARS-CoV-2/FLU/RSV plus assay is intended  as an aid in the diagnosis of influenza from Nasopharyngeal swab specimens and should not be used as a sole basis for treatment. Nasal washings and aspirates are unacceptable for Xpert Xpress SARS-CoV-2/FLU/RSV testing.  Fact Sheet for Patients: BloggerCourse.com  Fact Sheet for Healthcare Providers: SeriousBroker.it  This test is not yet  approved or cleared by the [place] and has been authorized for detection and/or diagnosis of SARS-CoV-2 by FDA under an Emergency Use Authorization (EUA). This EUA will remain in effect (meaning this test can be used) for the duration of the COVID-19 declaration under Section 564(b)(1) of the Act, 21 U.S.C. section 360bbb-3(b)(1), unless the authorization is terminated or revoked.  Performed at [facility], [address] [place], [place] [address]   POC CBG, ED     Status: Abnormal   Collection Time: 04/22/21  3:07 PM  Result Value Ref Range   Glucose-Capillary 224 (H) 70 - 99 mg/dL    Comment: Glucose reference range applies only to samples taken after fasting for at least 8 hours.  Basic metabolic panel     Status: Abnormal   Collection Time: 04/22/21  3:10 PM  Result Value Ref Range   Sodium 132 (L) 135 - 145 mmol/L   Potassium 4.4 3.5 - 5.1 mmol/L   Chloride 106 98 - 111 mmol/L   CO2 17 (L) 22 - 32 mmol/L   Glucose, Bld 216 (H) 70 - 99 mg/dL    Comment: Glucose reference range applies only to samples taken after fasting for at least 8 hours.   BUN 15 6 - 20 mg/dL   Creatinine, Ser 6.04 0.44 - 1.00 mg/dL   Calcium 8.3 (L) 8.9 - 10.3 mg/dL   GFR, Estimated >54 >09 mL/min    Comment: (NOTE) Calculated using the CKD-EPI Creatinine Equation (2021)    Anion gap 9 5 - 15    Comment: Performed at [facility], [address] [place], [place] [address]  CBG monitoring, ED     Status: Abnormal   Collection Time: 04/22/21  5:34 PM  Result Value Ref Range    Glucose-Capillary 196 (H) 70 - 99 mg/dL    Comment: Glucose reference range applies only to samples taken after fasting for at least 8 hours.  Comprehensive metabolic panel     Status: Abnormal   Collection Time: 04/22/21  5:50 PM  Result Value Ref Range   Sodium 132 (L) 135 - 145 mmol/L   Potassium 3.8 3.5 - 5.1 mmol/L   Chloride 107 98 - 111 mmol/L   CO2 18 (L) 22 - 32 mmol/L   Glucose, Bld 236 (H) 70 - 99 mg/dL    Comment: Glucose reference range applies only to samples taken after fasting for at least 8 hours.   BUN 13 6 - 20 mg/dL   Creatinine, Ser 4.78 0.44 - 1.00 mg/dL   Calcium 8.3 (L) 8.9 - 10.3 mg/dL   Total Protein 6.5 6.5 - 8.1 g/dL   Albumin 3.3 (L) 3.5 - 5.0 g/dL   AST 16 15 - 41 U/L   ALT 18 0 - 44 U/L   Alkaline Phosphatase 54 38 - 126 U/L   Total Bilirubin 1.6 (H) 0.3 - 1.2 mg/dL   GFR, Estimated >29 >56 mL/min    Comment: (NOTE) Calculated using the CKD-EPI Creatinine Equation (2021)    Anion gap 7 5 - 15    Comment: Performed at [facility], [address] [place], [place] [address]  CBG monitoring, ED     Status: Abnormal   Collection Time: 04/22/21  6:37 PM  Result Value Ref Range   Glucose-Capillary 200 (H) 70 - 99 mg/dL    Comment: Glucose reference range applies only to samples taken after fasting for at least 8 hours.  CBG monitoring, ED  Status: Abnormal   Collection Time: 04/22/21  8:03 PM  Result Value Ref Range   Glucose-Capillary 161 (H) 70 - 99 mg/dL    Comment: Glucose reference range applies only to samples taken after fasting for at least 8 hours.   Comment 1 Document in Chart   Lactic acid, plasma     Status: None   Collection Time: 04/22/21  8:04 PM  Result Value Ref Range   Lactic Acid, Venous 1.5 0.5 - 1.9 mmol/L    Comment: Performed at [facility], [address] [place], [place] [address]  Basic metabolic panel     Status: Abnormal   Collection Time: 04/22/21  8:04 PM  Result Value Ref Range   Sodium 132  (L) 135 - 145 mmol/L   Potassium 3.7 3.5 - 5.1 mmol/L   Chloride 108 98 - 111 mmol/L   CO2 19 (L) 22 - 32 mmol/L   Glucose, Bld 158 (H) 70 - 99 mg/dL    Comment: Glucose reference range applies only to samples taken after fasting for at least 8 hours.   BUN 13 6 - 20 mg/dL   Creatinine, Ser 2.82 0.44 - 1.00 mg/dL   Calcium 8.6 (L) 8.9 - 10.3 mg/dL   GFR, Estimated >06 >01 mL/min    Comment: (NOTE) Calculated using the CKD-EPI Creatinine Equation (2021)    Anion gap 5 5 - 15    Comment: Performed at [facility], [address] [place], [place] [address]  Beta-hydroxybutyric acid     Status: Abnormal   Collection Time: 04/22/21  8:04 PM  Result Value Ref Range   Beta-Hydroxybutyric Acid 1.53 (H) 0.05 - 0.27 mmol/L    Comment: Performed at [facility], [address] [place], [place] [address]  CBG monitoring, ED     Status: Abnormal   Collection Time: 04/22/21 10:00 PM  Result Value Ref Range   Glucose-Capillary 115 (H) 70 - 99 mg/dL    Comment: Glucose reference range applies only to samples taken after fasting for at least 8 hours.   Comment 1 Document in Chart   CBG monitoring, ED     Status: Abnormal   Collection Time: 04/23/21 12:19 AM  Result Value Ref Range   Glucose-Capillary 149 (H) 70 - 99 mg/dL    Comment: Glucose reference range applies only to samples taken after fasting for at least 8 hours.   Comment 1 Document in Chart   Basic metabolic panel     Status: Abnormal   Collection Time: 04/23/21 12:20 AM  Result Value Ref Range   Sodium 132 (L) 135 - 145 mmol/L   Potassium 4.0 3.5 - 5.1 mmol/L   Chloride 106 98 - 111 mmol/L   CO2 20 (L) 22 - 32 mmol/L   Glucose, Bld 143 (H) 70 - 99 mg/dL    Comment: Glucose reference range applies only to samples taken after fasting for at least 8 hours.   BUN 12 6 - 20 mg/dL   Creatinine, Ser 7.61 0.44 - 1.00 mg/dL   Calcium 8.1 (L) 8.9 - 10.3 mg/dL   GFR, Estimated >47 >09 mL/min    Comment:  (NOTE) Calculated using the CKD-EPI Creatinine Equation (2021)    Anion gap 6 5 - 15    Comment: Performed at [facility] [address], [place], [place] [address]  CBG monitoring, ED     Status: Abnormal   Collection Time: 04/23/21  2:02 AM  Result Value Ref Range  Glucose-Capillary 180 (H) 70 - 99 mg/dL    Comment: Glucose reference range applies only to samples taken after fasting for at least 8 hours.   Comment 1 Document in Chart   CBG monitoring, ED     Status: Abnormal   Collection Time: 04/23/21  3:51 AM  Result Value Ref Range   Glucose-Capillary 108 (H) 70 - 99 mg/dL    Comment: Glucose reference range applies only to samples taken after fasting for at least 8 hours.   Comment 1 Document in Chart   Basic metabolic panel     Status: Abnormal   Collection Time: 04/23/21  5:21 AM  Result Value Ref Range   Sodium 133 (L) 135 - 145 mmol/L   Potassium 3.6 3.5 - 5.1 mmol/L   Chloride 105 98 - 111 mmol/L   CO2 21 (L) 22 - 32 mmol/L   Glucose, Bld 162 (H) 70 - 99 mg/dL    Comment: Glucose reference range applies only to samples taken after fasting for at least 8 hours.   BUN 12 6 - 20 mg/dL   Creatinine, Ser 1.79 0.44 - 1.00 mg/dL   Calcium 8.3 (L) 8.9 - 10.3 mg/dL   GFR, Estimated >15 >05 mL/min    Comment: (NOTE) Calculated using the CKD-EPI Creatinine Equation (2021)    Anion gap 7 5 - 15    Comment: Performed at [facility], [address] [place], [place] [address]  Beta-hydroxybutyric acid     Status: Abnormal   Collection Time: 04/23/21  5:21 AM  Result Value Ref Range   Beta-Hydroxybutyric Acid 1.00 (H) 0.05 - 0.27 mmol/L    Comment: Performed at [facility], [address] [place], [place] [address]  CBG monitoring, ED     Status: Abnormal   Collection Time: 04/23/21  6:06 AM  Result Value Ref Range   Glucose-Capillary 190 (H) 70 - 99 mg/dL    Comment: Glucose reference range applies only to samples taken after fasting for at  least 8 hours.   Comment 1 Document in Chart   CBG monitoring, ED     Status: Abnormal   Collection Time: 04/23/21  7:26 AM  Result Value Ref Range   Glucose-Capillary 184 (H) 70 - 99 mg/dL    Comment: Glucose reference range applies only to samples taken after fasting for at least 8 hours.  CBG monitoring, ED     Status: Abnormal   Collection Time: 04/23/21  8:58 AM  Result Value Ref Range   Glucose-Capillary 127 (H) 70 - 99 mg/dL    Comment: Glucose reference range applies only to samples taken after fasting for at least 8 hours.  Basic metabolic panel     Status: Abnormal   Collection Time: 04/23/21  2:20 PM  Result Value Ref Range   Sodium 130 (L) 135 - 145 mmol/L   Potassium 3.6 3.5 - 5.1 mmol/L   Chloride 105 98 - 111 mmol/L   CO2 20 (L) 22 - 32 mmol/L   Glucose, Bld 235 (H) 70 - 99 mg/dL    Comment: Glucose reference range applies only to samples taken after fasting for at least 8 hours.   BUN 9 6 - 20 mg/dL   Creatinine, Ser 5.37 0.44 - 1.00 mg/dL   Calcium 7.7 (L) 8.9 - 10.3 mg/dL   GFR, Estimated >48 >27 mL/min    Comment: (NOTE) Calculated using the CKD-EPI Creatinine Equation (2021)    Anion gap 5 5 - 15  Comment: Performed at [facility], [address] [place], [place] [address]  Beta-hydroxybutyric acid     Status: Abnormal   Collection Time: 04/23/21  2:20 PM  Result Value Ref Range   Beta-Hydroxybutyric Acid 1.95 (H) 0.05 - 0.27 mmol/L    Comment: Performed at [facility], [address] [place], [place] [address]  Glucose, capillary     Status: Abnormal   Collection Time: 04/23/21  4:41 PM  Result Value Ref Range   Glucose-Capillary 188 (H) 70 - 99 mg/dL    Comment: Glucose reference range applies only to samples taken after fasting for at least 8 hours.    No results found.  Current scheduled medications  insulin aspart  0-16 Units Subcutaneous TID PC   insulin detemir  10 Units Subcutaneous Daily   ondansetron (ZOFRAN) IV  4  mg Intravenous Q6H    I have reviewed the patient's current medications.  ASSESSMENT: Patient Active Problem List   Diagnosis Date Noted   Hyperemesis 04/23/2021   Hyperemesis gravidarum 04/23/2021   Type 1 diabetes mellitus with hyperglycemia (HCC) 04/22/2021   Supervision of high risk pregnancy in first trimester 04/12/2021   [redacted] weeks gestation of pregnancy    Abnormal findings on antenatal screening    Anxiety 10/18/2015    PLAN: 1. Continue routine antenatal care. -Discussed care with Dr. Valentino Saxon  -Consulted Dr. Elwyn Lade with [facility] for management of diabetes   2. Hyperemesis  -Scheduled antiemetics  -Clear liquids, plan to advance to carb modified diet as tolerated  -Strict I/O's -Daily weights   3. Type 1 DM with hyperglycemia  - Transitioned from insulin drip with subcutaneous insulin -Will plan to transition to insulin pump when tolerating carb modified diet  -DM coordinator consult in place    Margaretmary Eddy, CNM Certified Nurse Midwife [facility]

## 2021-04-23 NOTE — Progress Notes (Signed)
Pt transferred to rm [id number]. VSS, patient complaining of N/V. Pt given emesis bag. CBG 188, CNM Margaretmary Eddy notified.

## 2021-04-23 NOTE — H&P (Addendum)
OB History & Physical   History of Present Illness:  Chief Complaint:   HPI:  Cheryl Avila is a 25 y.o. [phone] female at [redacted]w[redacted]d dated by a 7 week u/s.  She presented to the ED for hyperemesis and hyperglycemia.   Pregnancy Issues: 1. 1. Severe Nausea and Vomiting 2. Type 1 Diabetes Mellitus in Pregnancy           Endocrinologist: Dr. Gershon Crane - last appt 02/28/21 3. Previous C/S x2 - G1 Classical, G2 LTCS 4. BMI 40 or more - Obesity Class III  5. H/o Preterm delivery          G1 at [redacted]w[redacted]d and G2 at [redacted]w[redacted]d   Maternal Medical History:   Past Medical History:  Diagnosis Date   Diabetes mellitus without complication (HCC)     Past Surgical History:  Procedure Laterality Date   CESAREAN SECTION     NO PAST SURGERIES      Allergies  Allergen Reactions   Aleve [Naproxen] Hives    Prior to Admission medications   Medication Sig Start Date End Date Taking? Authorizing Provider  insulin aspart (NOVOLOG) 100 UNIT/ML FlexPen Inject 0-100 Units into the skin as directed. Via insulin pump   Yes [provider]  ondansetron (ZOFRAN ODT) 4 MG disintegrating tablet Take 1 tablet (4 mg total) by mouth every 8 (eight) hours as needed for nausea or vomiting. 04/14/21  Yes Phineas Semen, MD  Prenatal 28-0.8 MG TABS Take 1 tablet by mouth daily.   Yes [provider]  famotidine (PEPCID) 20 MG tablet Take 1 tablet (20 mg total) by mouth 2 (two) times daily. Patient not taking: Reported on 04/22/2021 02/16/21   Sharman Cheek, MD  HYDROcodone-acetaminophen (NORCO/VICODIN) 5-325 MG tablet Take 1 tablet by mouth every 6 (six) hours as needed for severe pain (sore throat, pain). Patient not taking: No sig reported 12/31/20 12/31/21  Shaune Pollack, MD  ibuprofen (ADVIL) 600 MG tablet Take 1 tablet (600 mg total) by mouth every 6 (six) hours as needed for moderate pain or fever. Patient not taking: No sig reported 12/31/20   Shaune Pollack, MD     Prenatal care site:  [facility]        Social History: She  reports that she has never smoked. She has never used smokeless tobacco. She reports that she does not drink alcohol and does not use drugs.  Family History: family history is not on file.   Review of Systems: A full review of systems was performed and negative except as noted in the HPI.    Physical Exam:  Vital Signs: BP 105/70 (BP Location: Right Arm)   Pulse 95   Temp 98.1 F (36.7 C) (Oral)   Resp 17   Ht 5' (1.524 m)   Wt 90.7 kg   LMP 01/27/2021 (Approximate)   SpO2 98%   BMI 39.06 kg/m   General:   alert and cooperative  Skin:  normal  Neurologic:    Alert & oriented x 3  Lungs:   Nl effort  Heart:   regular rate and rhythm  Abdomen:  soft, non-tender; bowel sounds normal; no masses,  no organomegaly  Pelvis:  Exam deferred.  Extremities: : non-tender, symmetric, no  edema bilaterally.      Pertinent Results:   Results for orders placed or performed during the hospital encounter of 04/22/21 (from the past 24 hour(s))  Beta-hydroxybutyric acid     Status: Abnormal   Collection Time: 04/22/21  2:00 PM  Result Value Ref Range   Beta-Hydroxybutyric Acid 1.82 (H) 0.05 - 0.27 mmol/L  Resp Panel by RT-PCR (Flu A&B, Covid) Nasopharyngeal Swab     Status: None   Collection Time: 04/22/21  2:13 PM   Specimen: Nasopharyngeal Swab; Nasopharyngeal(NP) swabs in vial transport medium  Result Value Ref Range   SARS Coronavirus 2 by RT PCR NEGATIVE NEGATIVE   Influenza A by PCR NEGATIVE NEGATIVE   Influenza B by PCR NEGATIVE NEGATIVE  POC CBG, ED     Status: Abnormal   Collection Time: 04/22/21  3:07 PM  Result Value Ref Range   Glucose-Capillary 224 (H) 70 - 99 mg/dL  Basic metabolic panel     Status: Abnormal   Collection Time: 04/22/21  3:10 PM  Result Value Ref Range   Sodium 132 (L) 135 - 145 mmol/L   Potassium 4.4 3.5 - 5.1 mmol/L   Chloride 106 98 - 111 mmol/L   CO2 17 (L) 22 - 32 mmol/L   Glucose, Bld 216 (H) 70  - 99 mg/dL   BUN 15 6 - 20 mg/dL   Creatinine, Ser 8.84 0.44 - 1.00 mg/dL   Calcium 8.3 (L) 8.9 - 10.3 mg/dL   GFR, Estimated >16 >60 mL/min   Anion gap 9 5 - 15  CBG monitoring, ED     Status: Abnormal   Collection Time: 04/22/21  5:34 PM  Result Value Ref Range   Glucose-Capillary 196 (H) 70 - 99 mg/dL  Comprehensive metabolic panel     Status: Abnormal   Collection Time: 04/22/21  5:50 PM  Result Value Ref Range   Sodium 132 (L) 135 - 145 mmol/L   Potassium 3.8 3.5 - 5.1 mmol/L   Chloride 107 98 - 111 mmol/L   CO2 18 (L) 22 - 32 mmol/L   Glucose, Bld 236 (H) 70 - 99 mg/dL   BUN 13 6 - 20 mg/dL   Creatinine, Ser 6.30 0.44 - 1.00 mg/dL   Calcium 8.3 (L) 8.9 - 10.3 mg/dL   Total Protein 6.5 6.5 - 8.1 g/dL   Albumin 3.3 (L) 3.5 - 5.0 g/dL   AST 16 15 - 41 U/L   ALT 18 0 - 44 U/L   Alkaline Phosphatase 54 38 - 126 U/L   Total Bilirubin 1.6 (H) 0.3 - 1.2 mg/dL   GFR, Estimated >16 >01 mL/min   Anion gap 7 5 - 15  CBG monitoring, ED     Status: Abnormal   Collection Time: 04/22/21  6:37 PM  Result Value Ref Range   Glucose-Capillary 200 (H) 70 - 99 mg/dL   Assessment:  Cheryl Avila is a 25 y.o. [id number] female at [redacted]w[redacted]d with hyperemesis and hyperglycemia.   Plan:  1. Admit to stepdown unit  2. Hyperemesis - Antiemetics - NPO - Strict I&Os - Daily weights  3. Type 1 DM with hyperglycemia - Insulin drip managed by ED doctor - DM coordinator consult order placed - Diabetes Treatment for pregnant / postpartum order set placed with aid from DM coordinator  Haroldine Laws, CNM 04/23/2021 1:33 PM

## 2021-04-23 NOTE — Progress Notes (Signed)
Spoke with Haroldine Laws via telephone CNM regarding transitioning patient off endo-tool. Advised that she wanted patient to remain on endotool to closely monitor blood sugar for the rest of the night.   Lily Peer RN, Admin Coord.

## 2021-04-24 ENCOUNTER — Inpatient Hospital Stay: Payer: Medicaid Other

## 2021-04-24 LAB — GLUCOSE, CAPILLARY
Glucose-Capillary: 278 mg/dL — ABNORMAL HIGH (ref 70–99)
Glucose-Capillary: 179 mg/dL — ABNORMAL HIGH (ref 70–99)
Glucose-Capillary: 192 mg/dL — ABNORMAL HIGH (ref 70–99)
Glucose-Capillary: 44 mg/dL — CL (ref 70–99)
Glucose-Capillary: 177 mg/dL — ABNORMAL HIGH (ref 70–99)
Glucose-Capillary: 46 mg/dL — ABNORMAL LOW (ref 70–99)
Glucose-Capillary: 186 mg/dL — ABNORMAL HIGH (ref 70–99)
Glucose-Capillary: 178 mg/dL — ABNORMAL HIGH (ref 70–99)

## 2021-04-24 MED ORDER — INSULIN DETEMIR 100 UNIT/ML ~~LOC~~ SOLN
20.0000 [IU] | Freq: Every day | SUBCUTANEOUS | Status: DC
Start: 2021-04-25 — End: 2021-04-26
  Administered 2021-04-25: 20 [IU] via SUBCUTANEOUS
  Filled 2021-04-24 (×2): qty 0.2

## 2021-04-24 MED ORDER — SODIUM CHLORIDE 0.9 % IV SOLN: INTRAVENOUS | Status: AC

## 2021-04-24 MED ORDER — INSULIN DETEMIR 100 UNIT/ML ~~LOC~~ SOLN
30.0000 [IU] | Freq: Every day | SUBCUTANEOUS | Status: DC
  Filled 2021-04-24: qty 0.3

## 2021-04-24 MED ORDER — DEXTROSE 50 % IV SOLN
25.0000 g | INTRAVENOUS | Status: AC
  Administered 2021-04-24: 25 g via INTRAVENOUS

## 2021-04-24 MED ORDER — INFLUENZA VAC SPLIT QUAD 0.5 ML IM SUSY: 0.5000 mL | PREFILLED_SYRINGE | INTRAMUSCULAR | Status: DC

## 2021-04-24 MED ORDER — INSULIN DETEMIR 100 UNIT/ML ~~LOC~~ SOLN
20.0000 [IU] | Freq: Once | SUBCUTANEOUS | Status: AC
  Administered 2021-04-24: 20 [IU] via SUBCUTANEOUS
  Filled 2021-04-24: qty 0.2

## 2021-04-24 MED ORDER — INSULIN ASPART 100 UNIT/ML IJ SOLN
0.0000 [IU] | INTRAMUSCULAR | Status: DC
Start: 2021-04-24 — End: 2021-04-25
  Administered 2021-04-24 – 2021-04-25 (×3): 2 [IU] via SUBCUTANEOUS
  Filled 2021-04-24 (×3): qty 1

## 2021-04-24 MED ORDER — ENOXAPARIN SODIUM 40 MG/0.4ML IJ SOSY: 40.0000 mg | PREFILLED_SYRINGE | INTRAMUSCULAR | Status: DC

## 2021-04-24 MED ORDER — INSULIN ASPART 100 UNIT/ML IJ SOLN
0.0000 [IU] | INTRAMUSCULAR | Status: DC
Start: 2021-04-24 — End: 2021-04-24
  Administered 2021-04-24: 3 [IU] via SUBCUTANEOUS
  Filled 2021-04-24: qty 1

## 2021-04-24 NOTE — Progress Notes (Signed)
PROGRESS NOTE    Cheryl Avila  GGE:[id number] DOB: 1996/04/26 DOA: 04/22/2021 PCP: [facility]  [id number]  Requesting physician: Dr Logan Bores Date of consultation: 04/23/21 Reason for consultation: Management of hyperglycemia  Assessment & Plan:   Principal Problem:   Type 1 diabetes mellitus with hyperglycemia (HCC) Active Problems:   [redacted] weeks gestation of pregnancy   Hyperemesis   Hyperemesis gravidarum   Cheryl Avila is a 25 year old female G3P0202 with a history of type 1 diabetes mellitus.  She is currently about [redacted] weeks pregnant.  She presented to the ER for evaluation of a 1 week history of nausea and persistent emesis with associated hyperglycemia.  Patient was noted to have blood sugar in the 400s despite using her insulin pump.  She has been unable to tolerate any oral intake.  She was acidotic upon arrival to the ER and due to her persistent nausea and vomiting was started on insulin drip.  Type 1 diabetes mellitus with DKA --off insulin gtt and transitioned to subQ insulin. --Per diabetic coordinator, pt gets 58.5 units for total basal rate per 24 hour period on her pump at home. --received Levemir 30u this morning, however, had hypoglycemic event. Plan: --cont Levemir at reduced 20u daily starting tomorrow --change SSI to q4h sensitive scale  Intrauterine pregnancy --management per Ob  Hyperemesis  --anti-emetics per Ob --cont NS@75  ml/hr for 12 hours  Hyponatremia --likely due to volume depletion.  Improved with IVF. --monitor Na   Subjective and Interval History:  Pt continued to have nausea, could only tolerate small amount of clear liquid.  Has swelling but no dyspnea.  No pain.     Objective: Vitals:   04/24/21 0342 04/24/21 0834 04/24/21 1145 04/24/21 1617  BP: 133/64 114/73 98/64 124/84  Pulse: 78 86 77   Resp: 18 18 18 18   Temp: 98.4 F (36.9 C) 98.7 F (37.1 C) 98.4 F (36.9 C) 98.8 F (37.1 C)  TempSrc: Oral Oral  Oral Oral  SpO2: 100% 100% 99% 98%  Weight:      Height:        Intake/Output Summary (Last 24 hours) at 04/24/2021 1810 Last data filed at 04/24/2021 1649 Gross per 24 hour  Intake 2233.21 ml  Output 2950 ml  Net -716.79 ml   Filed Weights   04/22/21 1238 04/23/21 1957  Weight: 90.7 kg 98.7 kg    Examination:   Constitutional: NAD, AAOx3 HEENT: conjunctivae and lids normal, EOMI CV: No cyanosis.   RESP: normal respiratory effort, on RA SKIN: warm, dry Neuro: II - XII grossly intact.   Psych: subdued mood and affect.  Appropriate judgement and reason   Data Reviewed: I have personally reviewed following labs and imaging studies  CBC: Recent Labs  Lab 04/22/21 1251 04/23/21 1812  WBC 16.4* 10.9*  NEUTROABS 14.5*  --   HGB 14.6 12.9  HCT 41.5 37.0  MCV 88.1 86.7  PLT 319 158   Basic Metabolic Panel: Recent Labs  Lab 04/22/21 1750 04/22/21 2004 04/23/21 0020 04/23/21 0521 04/23/21 1420  NA 132* 132* 132* 133* 130*  K 3.8 3.7 4.0 3.6 3.6  CL 107 108 106 105 105  CO2 18* 19* 20* 21* 20*  GLUCOSE 236* 158* 143* 162* 235*  BUN 13 13 12 12 9   CREATININE 0.71 0.59 0.63 0.80 0.57  CALCIUM 8.3* 8.6* 8.1* 8.3* 7.7*   GFR: Estimated Creatinine Clearance: 113.4 mL/min (by C-G formula based on SCr of 0.57 mg/dL). Liver Function Tests: Recent  Labs  Lab 04/22/21 1251 04/22/21 1750  AST 27 16  ALT 21 18  ALKPHOS 68 54  BILITOT 2.4* 1.6*  PROT 8.1 6.5  ALBUMIN 4.1 3.3*   No results for input(s): LIPASE, AMYLASE in the last 168 hours. No results for input(s): AMMONIA in the last 168 hours. Coagulation Profile: No results for input(s): INR, PROTIME in the last 168 hours. Cardiac Enzymes: No results for input(s): CKTOTAL, CKMB, CKMBINDEX, TROPONINI in the last 168 hours. BNP (last 3 results) No results for input(s): PROBNP in the last 8760 hours. HbA1C: No results for input(s): HGBA1C in the last 72 hours. CBG: Recent Labs  Lab 04/24/21 1229  04/24/21 1611 04/24/21 1630 04/24/21 1659 04/24/21 1806  GLUCAP 192* 44* 46* 177* 186*   Lipid Profile: No results for input(s): CHOL, HDL, LDLCALC, TRIG, CHOLHDL, LDLDIRECT in the last 72 hours. Thyroid Function Tests: No results for input(s): TSH, T4TOTAL, FREET4, T3FREE, THYROIDAB in the last 72 hours. Anemia Panel: No results for input(s): VITAMINB12, FOLATE, FERRITIN, TIBC, IRON, RETICCTPCT in the last 72 hours. Sepsis Labs: Recent Labs  Lab 04/22/21 1251 04/22/21 2004 04/23/21 1812  LATICACIDVEN 2.8* 1.5 1.8    Recent Results (from the past 240 hour(s))  Resp Panel by RT-PCR (Flu A&B, Covid) Nasopharyngeal Swab     Status: None   Collection Time: 04/22/21  2:13 PM   Specimen: Nasopharyngeal Swab; Nasopharyngeal(NP) swabs in vial transport medium  Result Value Ref Range Status   SARS Coronavirus 2 by RT PCR NEGATIVE NEGATIVE Final    Comment: (NOTE) SARS-CoV-2 target nucleic acids are NOT DETECTED.  The SARS-CoV-2 RNA is generally detectable in upper respiratory specimens during the acute phase of infection. The lowest concentration of SARS-CoV-2 viral copies this assay can detect is 138 copies/mL. A negative result does not preclude SARS-Cov-2 infection and should not be used as the sole basis for treatment or other patient management decisions. A negative result may occur with  improper specimen collection/handling, submission of specimen other than nasopharyngeal swab, presence of viral mutation(s) within the areas targeted by this assay, and inadequate number of viral copies(<138 copies/mL). A negative result must be combined with clinical observations, patient history, and epidemiological information. The expected result is Negative.  Fact Sheet for Patients:  BloggerCourse.com  Fact Sheet for Healthcare Providers:  SeriousBroker.it  This test is no t yet approved or cleared by the [place] FDA and  has  been authorized for detection and/or diagnosis of SARS-CoV-2 by FDA under an Emergency Use Authorization (EUA). This EUA will remain  in effect (meaning this test can be used) for the duration of the COVID-19 declaration under Section 564(b)(1) of the Act, 21 U.S.C.section 360bbb-3(b)(1), unless the authorization is terminated  or revoked sooner.       Influenza A by PCR NEGATIVE NEGATIVE Final   Influenza B by PCR NEGATIVE NEGATIVE Final    Comment: (NOTE) The Xpert Xpress SARS-CoV-2/FLU/RSV plus assay is intended as an aid in the diagnosis of influenza from Nasopharyngeal swab specimens and should not be used as a sole basis for treatment. Nasal washings and aspirates are unacceptable for Xpert Xpress SARS-CoV-2/FLU/RSV testing.  Fact Sheet for Patients: BloggerCourse.com  Fact Sheet for Healthcare Providers: SeriousBroker.it  This test is not yet approved or cleared by the [place] FDA and has been authorized for detection and/or diagnosis of SARS-CoV-2 by FDA under an Emergency Use Authorization (EUA). This EUA will remain in effect (meaning this test can be used) for the duration  of the COVID-19 declaration under Section 564(b)(1) of the Act, 21 U.S.C. section 360bbb-3(b)(1), unless the authorization is terminated or revoked.  Performed at [facility], [address], [place], [place] [address]       Radiology Studies: No results found.   Scheduled Meds:  famotidine  20 mg Oral Q12H   insulin aspart  0-9 Units Subcutaneous Q4H   [START ON 04/25/2021] insulin detemir  20 Units Subcutaneous Daily   metoCLOPramide  10 mg Oral Q6H   Or   metoCLOPramide (REGLAN) injection  10 mg Intravenous Q6H   ondansetron (ZOFRAN) IV  4 mg Intravenous Q6H   Continuous Infusions:  sodium chloride 75 mL/hr at 04/24/21 1053   famotidine (PEPCID) IV 20 mg (04/24/21 1054)   promethazine (PHENERGAN) injection (IM or  IVPB) 12.5 mg (04/24/21 1405)     LOS: 1 day     Darlin Priestly, MD [facility] If 7PM-7AM, please contact night-coverage 04/24/2021, 6:11 PM

## 2021-04-24 NOTE — Progress Notes (Signed)
Vitals have been stable throughout the night. CBG at bedtime 165. No complaints of pain. Urine output adequate ( this shift).  Patient states she tolerated a popsicle last night at dinner time. Patient complained of intermittent nausea and dry heaving from 1900-2100 last night. No emesis.  PRN phenergan given twice this shift. Patient states she has had no nausea since receiving phenergan. RN encouraged patient to drink water. Patient states she has not drank water throughout the night, as she has just wanted to sleep. RN will continue to monitor.

## 2021-04-24 NOTE — Progress Notes (Signed)
Pts CBG 278. Pt tolerated 1 popsicle and a few sips of broth this morning. 8 units of Novolog given.

## 2021-04-24 NOTE — Progress Notes (Signed)
Patient taken to ultrasound.

## 2021-04-24 NOTE — Progress Notes (Addendum)
ADA Standards of Care 2022 Diabetes in Pregnancy Target Glucose Ranges:   Fasting: 60 - 90 mg/dL Preprandial: 60 - 161 mg/dL 1 hr postprandial: Less than 140mg /dL (from first bite of meal) 2 hr postprandial: Less than 120 mg/dL (from first bit of meal)   Results for Cheryl Avila, Cheryl Avila (MRN Meriel Flavors) as of 04/24/2021 08:23  Ref. Range 04/23/2021 07:26 04/23/2021 08:58 04/23/2021 16:41 04/23/2021 21:47  Glucose-Capillary Latest Ref Range: 70 - 99 mg/dL 13/05/2021 (H) 811 (H)  10 units Levemir @1112  188 (H)  4 units Novolog @1725  165 (H)  Results for Cheryl Avila, Cheryl Avila (MRN ) as of 04/24/2021 08:23  Ref. Range 04/24/2021 07:34  Glucose-Capillary Latest Ref Range: 70 - 99 mg/dL [id number] (H)  8 units Novolog  10 units Levemir  Admit Emesis and Hyperglycemia [redacted] weeks Pregnant   History of Type 1 Diabetes   Home DM Meds: T slim pump Insulin Pump Basal rates MN=2.4 6am=2.5 10am=2.4 5pm=2.5 10pm=2.4 TDD basal: 58.5  Bolus settings I/C: 8 ISF: 20 Target Glucose: 110 Active insulin time: 5   Current Orders: Levemir 10 units daily     Novolog 0-16 units TID with meals    MD- Note CBG 278 this AM.  Per review of her home Insulin Pump settings, pt gets 58.5 units for total basal rate per 24 hour period on her pump at home.  Getting CL diet but only tolerated a popsicle last night and then, per nursing notes, pt had nausea and dry heaving from about 1900-2100 last PM.  Not drinking much water.  Please consider:  1. Increase Levemir to 50% total home basal dose on her pump: Levemir 30 units Daily  2. Change the Novolog SSI coverage to Q4 hours while pt not tolerating POs well yet    --Will follow patient during hospitalization--  04/26/2021 RN, MSN, CDE Diabetes Coordinator Inpatient Glycemic Control Team Team Pager: [phone] (8a-5p)

## 2021-04-24 NOTE — Progress Notes (Addendum)
Hypoglycemic Event  CBG: 44  Treatment: 8 oz juice/soda  Symptoms: Pale, shaky, foggy  Follow-up CBG: Time:16:28 CBG Result:46  Possible Reasons for Event: Vomiting and Inadequate meal intake  Comments/MD notified: CNM Margaretmary Eddy notified. New orders placed for D50 in 25g. CBG 15 minutes after: 177.   Will recheck in an hour.    Cheryl Avila

## 2021-04-24 NOTE — Progress Notes (Signed)
ANTEPARTUM PROGRESS NOTE  Cheryl Avila is a 25 y.o. [id number] at [redacted]w[redacted]d who is admitted for severe n/v in pregnancy and DKA.   Estimated Date of Delivery: 11/25/21  Length of Stay:  1 Days. Admitted 04/22/2021  Subjective: -Able to tolerate clear liquids but still having nausea  -Has not been able to progress to solid food without increasing nausea symptoms   She reports:  -no vaginal bleeding -no cramping   Vitals:  BP 98/64 (BP Location: Left Arm)   Pulse 77   Temp 98.4 F (36.9 C) (Oral)   Resp 18   Ht 5' (1.524 m)   Wt 98.7 kg   LMP 01/27/2021 (Approximate)   SpO2 99%   BMI 42.50 kg/m  Physical Examination: General:   alert, cooperative, and no distress  Skin:  normal  Neurologic:    Alert & oriented x 3  Lungs:    No use of accessory muscles   Heart:   regular rate and rhythm  Abdomen:  soft, non-tender; bowel sounds normal; no masses,  no organomegaly  Pelvis:  Exam deferred.  Extremities: : non-tender, symmetric, 1+/1+ edema bilaterally.  DTRs: 2+/2+     Results for orders placed or performed during the hospital encounter of 04/22/21 (from the past 48 hour(s))  CBG monitoring, ED     Status: Abnormal   Collection Time: 04/22/21  5:34 PM  Result Value Ref Range   Glucose-Capillary 196 (H) 70 - 99 mg/dL    Comment: Glucose reference range applies only to samples taken after fasting for at least 8 hours.  Comprehensive metabolic panel     Status: Abnormal   Collection Time: 04/22/21  5:50 PM  Result Value Ref Range   Sodium 132 (L) 135 - 145 mmol/L   Potassium 3.8 3.5 - 5.1 mmol/L   Chloride 107 98 - 111 mmol/L   CO2 18 (L) 22 - 32 mmol/L   Glucose, Bld 236 (H) 70 - 99 mg/dL    Comment: Glucose reference range applies only to samples taken after fasting for at least 8 hours.   BUN 13 6 - 20 mg/dL   Creatinine, Ser 9.62 0.44 - 1.00 mg/dL   Calcium 8.3 (L) 8.9 - 10.3 mg/dL   Total Protein 6.5 6.5 - 8.1 g/dL   Albumin 3.3 (L) 3.5 - 5.0 g/dL   AST 16 15 -  41 U/L   ALT 18 0 - 44 U/L   Alkaline Phosphatase 54 38 - 126 U/L   Total Bilirubin 1.6 (H) 0.3 - 1.2 mg/dL   GFR, Estimated >22 >97 mL/min    Comment: (NOTE) Calculated using the CKD-EPI Creatinine Equation (2021)    Anion gap 7 5 - 15    Comment: Performed at [facility], [address] [place], [place] [address]  CBG monitoring, ED     Status: Abnormal   Collection Time: 04/22/21  6:37 PM  Result Value Ref Range   Glucose-Capillary 200 (H) 70 - 99 mg/dL    Comment: Glucose reference range applies only to samples taken after fasting for at least 8 hours.  CBG monitoring, ED     Status: Abnormal   Collection Time: 04/22/21  8:03 PM  Result Value Ref Range   Glucose-Capillary 161 (H) 70 - 99 mg/dL    Comment: Glucose reference range applies only to samples taken after fasting for at least 8 hours.   Comment 1 Document in Chart   Lactic acid, plasma     Status:  None   Collection Time: 04/22/21  8:04 PM  Result Value Ref Range   Lactic Acid, Venous 1.5 0.5 - 1.9 mmol/L    Comment: Performed at [facility], [address] [place], [place] [address]  Basic metabolic panel     Status: Abnormal   Collection Time: 04/22/21  8:04 PM  Result Value Ref Range   Sodium 132 (L) 135 - 145 mmol/L   Potassium 3.7 3.5 - 5.1 mmol/L   Chloride 108 98 - 111 mmol/L   CO2 19 (L) 22 - 32 mmol/L   Glucose, Bld 158 (H) 70 - 99 mg/dL    Comment: Glucose reference range applies only to samples taken after fasting for at least 8 hours.   BUN 13 6 - 20 mg/dL   Creatinine, Ser 8.84 0.44 - 1.00 mg/dL   Calcium 8.6 (L) 8.9 - 10.3 mg/dL   GFR, Estimated >16 >60 mL/min    Comment: (NOTE) Calculated using the CKD-EPI Creatinine Equation (2021)    Anion gap 5 5 - 15    Comment: Performed at [facility], [address] [place], [place] [address]  Beta-hydroxybutyric acid     Status: Abnormal   Collection Time: 04/22/21  8:04 PM  Result Value Ref Range    Beta-Hydroxybutyric Acid 1.53 (H) 0.05 - 0.27 mmol/L    Comment: Performed at [facility], [address] [place], [place] [address]  CBG monitoring, ED     Status: Abnormal   Collection Time: 04/22/21 10:00 PM  Result Value Ref Range   Glucose-Capillary 115 (H) 70 - 99 mg/dL    Comment: Glucose reference range applies only to samples taken after fasting for at least 8 hours.   Comment 1 Document in Chart   CBG monitoring, ED     Status: Abnormal   Collection Time: 04/23/21 12:19 AM  Result Value Ref Range   Glucose-Capillary 149 (H) 70 - 99 mg/dL    Comment: Glucose reference range applies only to samples taken after fasting for at least 8 hours.   Comment 1 Document in Chart   Basic metabolic panel     Status: Abnormal   Collection Time: 04/23/21 12:20 AM  Result Value Ref Range   Sodium 132 (L) 135 - 145 mmol/L   Potassium 4.0 3.5 - 5.1 mmol/L   Chloride 106 98 - 111 mmol/L   CO2 20 (L) 22 - 32 mmol/L   Glucose, Bld 143 (H) 70 - 99 mg/dL    Comment: Glucose reference range applies only to samples taken after fasting for at least 8 hours.   BUN 12 6 - 20 mg/dL   Creatinine, Ser 2.35 0.44 - 1.00 mg/dL   Calcium 8.1 (L) 8.9 - 10.3 mg/dL   GFR, Estimated >57 >32 mL/min    Comment: (NOTE) Calculated using the CKD-EPI Creatinine Equation (2021)    Anion gap 6 5 - 15    Comment: Performed at [facility], [address] [place], [place] [address]  CBG monitoring, ED     Status: Abnormal   Collection Time: 04/23/21  2:02 AM  Result Value Ref Range   Glucose-Capillary 180 (H) 70 - 99 mg/dL    Comment: Glucose reference range applies only to samples taken after fasting for at least 8 hours.   Comment 1 Document in Chart   CBG monitoring, ED     Status: Abnormal   Collection Time: 04/23/21  3:51 AM  Result Value Ref Range  Glucose-Capillary 108 (H) 70 - 99 mg/dL    Comment: Glucose reference range applies only to samples taken after fasting for at least 8  hours.   Comment 1 Document in Chart   Basic metabolic panel     Status: Abnormal   Collection Time: 04/23/21  5:21 AM  Result Value Ref Range   Sodium 133 (L) 135 - 145 mmol/L   Potassium 3.6 3.5 - 5.1 mmol/L   Chloride 105 98 - 111 mmol/L   CO2 21 (L) 22 - 32 mmol/L   Glucose, Bld 162 (H) 70 - 99 mg/dL    Comment: Glucose reference range applies only to samples taken after fasting for at least 8 hours.   BUN 12 6 - 20 mg/dL   Creatinine, Ser 2.44 0.44 - 1.00 mg/dL   Calcium 8.3 (L) 8.9 - 10.3 mg/dL   GFR, Estimated >01 >02 mL/min    Comment: (NOTE) Calculated using the CKD-EPI Creatinine Equation (2021)    Anion gap 7 5 - 15    Comment: Performed at [facility], [address] [place], [place] [address]  Beta-hydroxybutyric acid     Status: Abnormal   Collection Time: 04/23/21  5:21 AM  Result Value Ref Range   Beta-Hydroxybutyric Acid 1.00 (H) 0.05 - 0.27 mmol/L    Comment: Performed at [facility], [address] [place], [place] [address]  CBG monitoring, ED     Status: Abnormal   Collection Time: 04/23/21  6:06 AM  Result Value Ref Range   Glucose-Capillary 190 (H) 70 - 99 mg/dL    Comment: Glucose reference range applies only to samples taken after fasting for at least 8 hours.   Comment 1 Document in Chart   CBG monitoring, ED     Status: Abnormal   Collection Time: 04/23/21  7:26 AM  Result Value Ref Range   Glucose-Capillary 184 (H) 70 - 99 mg/dL    Comment: Glucose reference range applies only to samples taken after fasting for at least 8 hours.  CBG monitoring, ED     Status: Abnormal   Collection Time: 04/23/21  8:58 AM  Result Value Ref Range   Glucose-Capillary 127 (H) 70 - 99 mg/dL    Comment: Glucose reference range applies only to samples taken after fasting for at least 8 hours.  Basic metabolic panel     Status: Abnormal   Collection Time: 04/23/21  2:20 PM  Result Value Ref Range   Sodium 130 (L) 135 - 145 mmol/L   Potassium  3.6 3.5 - 5.1 mmol/L   Chloride 105 98 - 111 mmol/L   CO2 20 (L) 22 - 32 mmol/L   Glucose, Bld 235 (H) 70 - 99 mg/dL    Comment: Glucose reference range applies only to samples taken after fasting for at least 8 hours.   BUN 9 6 - 20 mg/dL   Creatinine, Ser 4.74 0.44 - 1.00 mg/dL   Calcium 7.7 (L) 8.9 - 10.3 mg/dL   GFR, Estimated >25 >95 mL/min    Comment: (NOTE) Calculated using the CKD-EPI Creatinine Equation (2021)    Anion gap 5 5 - 15    Comment: Performed at [facility], [address] [place], [place] [address]  Beta-hydroxybutyric acid     Status: Abnormal   Collection Time: 04/23/21  2:20 PM  Result Value Ref Range   Beta-Hydroxybutyric Acid 1.95 (H) 0.05 - 0.27 mmol/L    Comment: Performed at [facility], [address] [place], [place] [address]  Glucose, capillary     Status: Abnormal   Collection Time: 04/23/21  4:41 PM  Result Value Ref Range   Glucose-Capillary 188 (H) 70 - 99 mg/dL    Comment: Glucose reference range applies only to samples taken after fasting for at least 8 hours.  CBC     Status: Abnormal   Collection Time: 04/23/21  6:12 PM  Result Value Ref Range   WBC 10.9 (H) 4.0 - 10.5 K/uL   RBC 4.27 3.87 - 5.11 MIL/uL   Hemoglobin 12.9 12.0 - 15.0 g/dL   HCT 60.4 54.0 - 98.1 %   MCV 86.7 80.0 - 100.0 fL   MCH 30.2 26.0 - 34.0 pg   MCHC 34.9 30.0 - 36.0 g/dL   RDW 19.1 47.8 - 29.5 %   Platelets 158 150 - 400 K/uL   nRBC 0.0 0.0 - 0.2 %    Comment: Performed at [facility], [address] [place], [place] [address]  Lactic acid, plasma     Status: None   Collection Time: 04/23/21  6:12 PM  Result Value Ref Range   Lactic Acid, Venous 1.8 0.5 - 1.9 mmol/L    Comment: Performed at [facility], [address] [place], [place] [address]  Glucose, capillary     Status: Abnormal   Collection Time: 04/23/21  9:47 PM  Result Value Ref Range   Glucose-Capillary 165 (H) 70 - 99 mg/dL    Comment: Glucose  reference range applies only to samples taken after fasting for at least 8 hours.  Glucose, capillary     Status: Abnormal   Collection Time: 04/24/21  7:34 AM  Result Value Ref Range   Glucose-Capillary 278 (H) 70 - 99 mg/dL    Comment: Glucose reference range applies only to samples taken after fasting for at least 8 hours.  Glucose, capillary     Status: Abnormal   Collection Time: 04/24/21 11:01 AM  Result Value Ref Range   Glucose-Capillary 179 (H) 70 - 99 mg/dL    Comment: Glucose reference range applies only to samples taken after fasting for at least 8 hours.   Comment 1 Notify RN   Glucose, capillary     Status: Abnormal   Collection Time: 04/24/21 12:29 PM  Result Value Ref Range   Glucose-Capillary 192 (H) 70 - 99 mg/dL    Comment: Glucose reference range applies only to samples taken after fasting for at least 8 hours.   Comment 1 Notify RN     No results found.  Current scheduled medications  famotidine  20 mg Oral Q12H   insulin aspart  0-15 Units Subcutaneous Q4H   [START ON 04/25/2021] insulin detemir  30 Units Subcutaneous Daily   metoCLOPramide  10 mg Oral Q6H   Or   metoCLOPramide (REGLAN) injection  10 mg Intravenous Q6H   ondansetron (ZOFRAN) IV  4 mg Intravenous Q6H    I have reviewed the patient's current medications.  ASSESSMENT: Patient Active Problem List   Diagnosis Date Noted   Hyperemesis 04/23/2021   Hyperemesis gravidarum 04/23/2021   Type 1 diabetes mellitus with hyperglycemia (HCC) 04/22/2021   Supervision of high risk pregnancy in first trimester 04/12/2021   [redacted] weeks gestation of pregnancy    Abnormal findings on antenatal screening    Anxiety 10/18/2015    PLAN: 1. Continue routine antenatal care. -Discussed care with Dr. Valentino Saxon  -Consult in place [facility] team for management of diabetes  -  TVUS ordered for assessment of fetal status.   2. Hyperemesis  -Scheduled antiemetics  -Clear liquids, plan to advance to carb  modified diet as tolerated  -Strict I/O's -Daily weights  -Considering steroids for management of nausea in pregnancy if nausea does not start to improve.  A steroid taper could be beneficial in managing her hyperemesis symptoms but would cause further hyperglycemia.    3. Type 1 DM with hyperglycemia  -Transitioned from insulin drip with subcutaneous insulin -Levemir increased to 30 units daily  -Q4hr CBG with sliding scale coverage while on liquid diet  -Will plan to transition to insulin pump when tolerating carb modified diet  -DM coordinator consult in place    Margaretmary Eddy, CNM Certified Nurse Midwife [place]  [facility]

## 2021-04-24 NOTE — Progress Notes (Signed)
Patient back in room from ultrasound. Patient states she feels very weak. Assisted with setting up patients dinner tray. Encouraged patient to drink what she can and get some rest. Re-educated patient on signs and symptoms for hypoglycemia. Patient verbalized understanding and will use call light for assistance.

## 2021-04-25 LAB — CBC
HCT: 33 % — ABNORMAL LOW (ref 36.0–46.0)
Hemoglobin: 11.6 g/dL — ABNORMAL LOW (ref 12.0–15.0)
MCH: 31.2 pg (ref 26.0–34.0)
MCHC: 35.2 g/dL (ref 30.0–36.0)
MCV: 88.7 fL (ref 80.0–100.0)
Platelets: 218 10*3/uL (ref 150–400)
RBC: 3.72 MIL/uL — ABNORMAL LOW (ref 3.87–5.11)
RDW: 12.6 % (ref 11.5–15.5)
WBC: 7.2 10*3/uL (ref 4.0–10.5)
nRBC: 0 % (ref 0.0–0.2)

## 2021-04-25 LAB — GLUCOSE, CAPILLARY
Glucose-Capillary: 123 mg/dL — ABNORMAL HIGH (ref 70–99)
Glucose-Capillary: 174 mg/dL — ABNORMAL HIGH (ref 70–99)
Glucose-Capillary: 182 mg/dL — ABNORMAL HIGH (ref 70–99)
Glucose-Capillary: 186 mg/dL — ABNORMAL HIGH (ref 70–99)
Glucose-Capillary: 222 mg/dL — ABNORMAL HIGH (ref 70–99)
Glucose-Capillary: 232 mg/dL — ABNORMAL HIGH (ref 70–99)
Glucose-Capillary: 311 mg/dL — ABNORMAL HIGH (ref 70–99)
Glucose-Capillary: 65 mg/dL — ABNORMAL LOW (ref 70–99)
Glucose-Capillary: 69 mg/dL — ABNORMAL LOW (ref 70–99)

## 2021-04-25 LAB — HEMOGLOBIN A1C
Hgb A1c MFr Bld: 5.9 % — ABNORMAL HIGH (ref 4.8–5.6)
Mean Plasma Glucose: 123 mg/dL

## 2021-04-25 LAB — MAGNESIUM: Magnesium: 1.7 mg/dL (ref 1.7–2.4)

## 2021-04-25 LAB — BASIC METABOLIC PANEL
Anion gap: 8 (ref 5–15)
BUN: 7 mg/dL (ref 6–20)
CO2: 21 mmol/L — ABNORMAL LOW (ref 22–32)
Calcium: 8.1 mg/dL — ABNORMAL LOW (ref 8.9–10.3)
Chloride: 105 mmol/L (ref 98–111)
Creatinine, Ser: 0.52 mg/dL (ref 0.44–1.00)
GFR, Estimated: >60 mL/min (ref >60)
Glucose, Bld: 209 mg/dL — ABNORMAL HIGH (ref 70–99)
Potassium: 3.4 mmol/L — ABNORMAL LOW (ref 3.5–5.1)
Sodium: 134 mmol/L — ABNORMAL LOW (ref 135–145)

## 2021-04-25 LAB — HCG, QUANTITATIVE, PREGNANCY: hCG, Beta Chain, Quant, S: 246248 m[IU]/mL — ABNORMAL HIGH (ref <5)

## 2021-04-25 MED ORDER — INSULIN ASPART 100 UNIT/ML IJ SOLN
0.0000 [IU] | Freq: Three times a day (TID) | INTRAMUSCULAR | Status: DC
  Administered 2021-04-25: 3 [IU] via SUBCUTANEOUS
  Administered 2021-04-25: 7 [IU] via SUBCUTANEOUS
  Administered 2021-04-26: 2 [IU] via SUBCUTANEOUS
  Filled 2021-04-25 (×3): qty 1

## 2021-04-25 MED ORDER — POTASSIUM CHLORIDE CRYS ER 20 MEQ PO TBCR
40.0000 meq | EXTENDED_RELEASE_TABLET | Freq: Once | ORAL | Status: AC
  Administered 2021-04-25: 40 meq via ORAL
  Filled 2021-04-25: qty 2

## 2021-04-25 MED ORDER — INSULIN ASPART 100 UNIT/ML IJ SOLN
5.0000 [IU] | Freq: Three times a day (TID) | INTRAMUSCULAR | Status: DC
  Administered 2021-04-25 – 2021-04-26 (×2): 5 [IU] via SUBCUTANEOUS
  Filled 2021-04-25 (×2): qty 1

## 2021-04-25 NOTE — Progress Notes (Addendum)
Inpatient Diabetes Program Recommendations  AACE/ADA: New Consensus Statement on Inpatient Glycemic Control (2015)  Target Ranges:  Prepandial:   less than 140 mg/dL      Peak postprandial:   less than 180 mg/dL (1-2 hours)      Critically ill patients:  140 - 180 mg/dL   Lab Results  Component Value Date   GLUCAP 174 (H) 04/25/2021   HGBA1C 5.9 (H) 04/22/2021    Review of Glycemic Control Results for Cheryl Avila, Cheryl Avila (MRN [id number]) as of 04/25/2021 09:19  Ref. Range 04/25/2021 01:44 04/25/2021 03:28 04/25/2021 07:56  Glucose-Capillary Latest Ref Range: 70 - 99 mg/dL 202 (H) 542 (H) 706 (H)   Admit Emesis and Hyperglycemia [redacted] weeks Pregnant   History of Type 1 Diabetes   Home DM Meds: T slim pump Insulin Pump Basal rates MN=2.4 6am=2.5 10am=2.4 5pm=2.5 10pm=2.4 TDD basal: 58.5  Bolus settings I/C: 8 ISF: 20 Target Glucose: 110 Active insulin time: 5   Current Orders: Levemir 20 units daily                           Novolog 0-9 units Q4H   Inpatient Diabetes Program Recommendations:     Page received from RN regarding insulin pump supplies. Patient needs refill on Dexcom and Tandem supplies. Also, feel patient needs significant reduction to insulin pump settings prior to reapplying or will run risk of hypoglycemia. Patient's mother plans to bring insulin pump to hospital. Call made to Dr Elberta Fortis office regarding reorder of pump supplies and insulin setting adjustment. Awaiting call back.  Of note, patient tolerating breakfast at this time.   Thanks, Lujean Rave, MSN, RNC-OB Diabetes Coordinator [phone] (8a-5p)

## 2021-04-25 NOTE — Progress Notes (Signed)
Pt CBG 69. 4oz OJ given per hypoglycemic protocol. Will recheck sugar in 

## 2021-04-25 NOTE — Progress Notes (Signed)
Patient transitioned to carb mod diet. Pt tolerated full breakfast meal without nausea.

## 2021-04-25 NOTE — Progress Notes (Signed)
PROGRESS NOTE    Cheryl Avila  FAO:[id number] DOB: 1995/07/12 DOA: 04/22/2021 PCP: [facility]  [id number]  Requesting physician: Dr Logan Bores Date of consultation: 04/23/21 Reason for consultation: Management of hyperglycemia  Assessment & Plan:   Principal Problem:   Type 1 diabetes mellitus with hyperglycemia (HCC) Active Problems:   [redacted] weeks gestation of pregnancy   Hyperemesis   Hyperemesis gravidarum   Cheryl Avila is a 25 year old female G3P0202 with a history of type 1 diabetes mellitus.  She is currently about [redacted] weeks pregnant.  She presented to the ER for evaluation of a 1 week history of nausea and persistent emesis with associated hyperglycemia.  Patient was noted to have blood sugar in the 400s despite using her insulin pump.  She has been unable to tolerate any oral intake.  She was acidotic upon arrival to the ER and due to her persistent nausea and vomiting was started on insulin drip.  Type 1 diabetes mellitus with DKA --off insulin gtt and transitioned to subQ insulin. --Per diabetic coordinator, pt gets 58.5 units for total basal rate per 24 hour period on her pump at home. --received Levemir 30u morning of 11/13, however, had hypoglycemic event. Plan: --cont Levemir 20u daily --add mealtime 5u TID --SSI TID  Intrauterine pregnancy --management per Ob  Hyperemesis, improved --anti-emetics per Ob  Hyponatremia --likely due to volume depletion.  Improved with IVF. --monitor Na   Subjective and Interval History:  Nausea improved.  Pt was able to eat regular meals today.   Objective: Vitals:   04/25/21 0634 04/25/21 0804 04/25/21 1106 04/25/21 1553  BP:  124/75 122/84 125/84  Pulse:  83 85 83  Resp:  20 18 18   Temp:  98.3 F (36.8 C) 98.7 F (37.1 C) 97.8 F (36.6 C)  TempSrc:  Oral Oral Oral  SpO2:  100%    Weight: 99.3 kg     Height:        Intake/Output Summary (Last 24 hours) at 04/25/2021 1624 Last data filed  at 04/25/2021 0300 Gross per 24 hour  Intake 825.9 ml  Output 1000 ml  Net -174.1 ml   Filed Weights   04/22/21 1238 04/23/21 1957 04/25/21 0634  Weight: 90.7 kg 98.7 kg 99.3 kg    Examination:   Constitutional: NAD, AAOx3 HEENT: conjunctivae and lids normal, EOMI CV: No cyanosis.   RESP: normal respiratory effort, on RA Extremities: No effusions, edema in BLE SKIN: warm, dry Neuro: II - XII grossly intact.   Psych: Normal mood and affect.  Appropriate judgement and reason   Data Reviewed: I have personally reviewed following labs and imaging studies  CBC: Recent Labs  Lab 04/22/21 1251 04/23/21 1812 04/25/21 0306  WBC 16.4* 10.9* 7.2  NEUTROABS 14.5*  --   --   HGB 14.6 12.9 11.6*  HCT 41.5 37.0 33.0*  MCV 88.1 86.7 88.7  PLT 319 158 218   Basic Metabolic Panel: Recent Labs  Lab 04/22/21 2004 04/23/21 0020 04/23/21 0521 04/23/21 1420 04/25/21 0306  NA 132* 132* 133* 130* 134*  K 3.7 4.0 3.6 3.6 3.4*  CL 108 106 105 105 105  CO2 19* 20* 21* 20* 21*  GLUCOSE 158* 143* 162* 235* 209*  BUN 13 12 12 9 7   CREATININE 0.59 0.63 0.80 0.57 0.52  CALCIUM 8.6* 8.1* 8.3* 7.7* 8.1*  MG  --   --   --   --  1.7   GFR: Estimated Creatinine Clearance: 113.7 mL/min (by C-G  formula based on SCr of 0.52 mg/dL). Liver Function Tests: Recent Labs  Lab 04/22/21 1251 04/22/21 1750  AST 27 16  ALT 21 18  ALKPHOS 68 54  BILITOT 2.4* 1.6*  PROT 8.1 6.5  ALBUMIN 4.1 3.3*   No results for input(s): LIPASE, AMYLASE in the last 168 hours. No results for input(s): AMMONIA in the last 168 hours. Coagulation Profile: No results for input(s): INR, PROTIME in the last 168 hours. Cardiac Enzymes: No results for input(s): CKTOTAL, CKMB, CKMBINDEX, TROPONINI in the last 168 hours. BNP (last 3 results) No results for input(s): PROBNP in the last 8760 hours. HbA1C: Recent Labs    04/22/21 1750  HGBA1C 5.9*   CBG: Recent Labs  Lab 04/25/21 0020 04/25/21 0144  04/25/21 0328 04/25/21 0756 04/25/21 1221  GLUCAP 65* 182* 186* 174* 232*   Lipid Profile: No results for input(s): CHOL, HDL, LDLCALC, TRIG, CHOLHDL, LDLDIRECT in the last 72 hours. Thyroid Function Tests: No results for input(s): TSH, T4TOTAL, FREET4, T3FREE, THYROIDAB in the last 72 hours. Anemia Panel: No results for input(s): VITAMINB12, FOLATE, FERRITIN, TIBC, IRON, RETICCTPCT in the last 72 hours. Sepsis Labs: Recent Labs  Lab 04/22/21 1251 04/22/21 2004 04/23/21 1812  LATICACIDVEN 2.8* 1.5 1.8    Recent Results (from the past 240 hour(s))  Resp Panel by RT-PCR (Flu A&B, Covid) Nasopharyngeal Swab     Status: None   Collection Time: 04/22/21  2:13 PM   Specimen: Nasopharyngeal Swab; Nasopharyngeal(NP) swabs in vial transport medium  Result Value Ref Range Status   SARS Coronavirus 2 by RT PCR NEGATIVE NEGATIVE Final    Comment: (NOTE) SARS-CoV-2 target nucleic acids are NOT DETECTED.  The SARS-CoV-2 RNA is generally detectable in upper respiratory specimens during the acute phase of infection. The lowest concentration of SARS-CoV-2 viral copies this assay can detect is 138 copies/mL. A negative result does not preclude SARS-Cov-2 infection and should not be used as the sole basis for treatment or other patient management decisions. A negative result may occur with  improper specimen collection/handling, submission of specimen other than nasopharyngeal swab, presence of viral mutation(s) within the areas targeted by this assay, and inadequate number of viral copies(<138 copies/mL). A negative result must be combined with clinical observations, patient history, and epidemiological information. The expected result is Negative.  Fact Sheet for Patients:  BloggerCourse.com  Fact Sheet for Healthcare Providers:  SeriousBroker.it  This test is no t yet approved or cleared by the [place] FDA and  has been  authorized for detection and/or diagnosis of SARS-CoV-2 by FDA under an Emergency Use Authorization (EUA). This EUA will remain  in effect (meaning this test can be used) for the duration of the COVID-19 declaration under Section 564(b)(1) of the Act, 21 U.S.C.section 360bbb-3(b)(1), unless the authorization is terminated  or revoked sooner.       Influenza A by PCR NEGATIVE NEGATIVE Final   Influenza B by PCR NEGATIVE NEGATIVE Final    Comment: (NOTE) The Xpert Xpress SARS-CoV-2/FLU/RSV plus assay is intended as an aid in the diagnosis of influenza from Nasopharyngeal swab specimens and should not be used as a sole basis for treatment. Nasal washings and aspirates are unacceptable for Xpert Xpress SARS-CoV-2/FLU/RSV testing.  Fact Sheet for Patients: BloggerCourse.com  Fact Sheet for Healthcare Providers: SeriousBroker.it  This test is not yet approved or cleared by the [place] FDA and has been authorized for detection and/or diagnosis of SARS-CoV-2 by FDA under an Emergency Use Authorization (EUA). This EUA  will remain in effect (meaning this test can be used) for the duration of the COVID-19 declaration under Section 564(b)(1) of the Act, 21 U.S.C. section 360bbb-3(b)(1), unless the authorization is terminated or revoked.  Performed at [facility], [address] [place], [place] [address]       Radiology Studies: US OB LESS THAN 14 WEEKS WITH OB TRANSVAGINAL  Result Date: 04/24/2021 CLINICAL DATA:  Gestational diabetes, pregnant, LMP 01/27/2021 EXAM: OBSTETRIC <14 WK [place] AND TRANSVAGINAL OB US TECHNIQUE: Both transabdominal and transvaginal ultrasound examinations were performed for complete evaluation of the gestation as well as the maternal uterus, adnexal regions, and pelvic cul-de-sac. Transvaginal technique was performed to assess early pregnancy. COMPARISON:  None. FINDINGS: Intrauterine gestational  sac: Single Yolk sac:  Visualized. Embryo:  Visualized. Cardiac Activity: Choose Heart Rate: 178 bpm MSD: Appropriate given fetal size CRL:  24 mm   9 w   1 d                  [place] EDC: 11/26/2020 Subchorionic hemorrhage:  None visualized. Maternal uterus/adnexae: The uterus is anteverted. The cervix is unremarkable. No intrauterine masses are seen. There is mild simple appearing free fluid within the pelvis. The maternal ovaries are unremarkable. No adnexal masses are seen. IMPRESSION: Single living intrauterine gestation with an estimated gestational age of [redacted] weeks, 1 day. No acute abnormality. Electronically Signed   By: Helyn Numbers M.D.   On: 04/24/2021 20:06     Scheduled Meds:  famotidine  20 mg Oral Q12H   influenza vac split quadrivalent PF  0.5 mL Intramuscular Tomorrow-1000   insulin aspart  0-9 Units Subcutaneous TID WC   insulin detemir  20 Units Subcutaneous Daily   metoCLOPramide  10 mg Oral Q6H   Or   metoCLOPramide (REGLAN) injection  10 mg Intravenous Q6H   ondansetron (ZOFRAN) IV  4 mg Intravenous Q6H   Continuous Infusions:  famotidine (PEPCID) IV 20 mg (04/24/21 2206)   promethazine (PHENERGAN) injection (IM or IVPB) 12.5 mg (04/24/21 2037)     LOS: 2 days     Darlin Priestly, MD [facility] If 7PM-7AM, please contact night-coverage 04/25/2021, 4:24 PM

## 2021-04-25 NOTE — Progress Notes (Signed)
Pt tolerating full meals without vomiting, as well as PO anti-nausea medication. Pt called out at 4:45pm complaining of spots and blurred vision.  CBG 311. Rechecked cbg prior to meal. CBG 222. Hospitalist notified. New orders placed for 5 units of Novolog.

## 2021-04-25 NOTE — Progress Notes (Signed)
Hypoglycemic Event  CBG: 69 Treatment: 4 oz juice/soda  Symptoms: None  Follow-up CBG: Time:0020 CBG Result:65  Possible Reasons for Event: Inadequate meal intake  Comments/MD notified:Cheryl Avila notified    Justus Memory

## 2021-04-25 NOTE — Progress Notes (Signed)
ANTEPARTUM PROGRESS NOTE  Cheryl Avila is a 25 y.o. [id number] at [redacted]w[redacted]d who is admitted for severe n/v in pregnancy and DKA.   Estimated Date of Delivery: 11/25/21  Length of Stay:  2 Days. Admitted 04/22/2021  Subjective: -Now able to tolerate regular diet without nausea  She reports:  -no vaginal bleeding -no cramping   Vitals:  BP 124/75 (BP Location: Left Arm)   Pulse 83   Temp 98.3 F (36.8 C) (Oral)   Resp 20   Ht 5' (1.524 m)   Wt 99.3 kg   LMP 01/27/2021 (Approximate)   SpO2 100%   BMI 42.75 kg/m  Physical Examination: General:   alert, cooperative, and no distress  Skin:  normal  Neurologic:    Alert & oriented x 3  Lungs:    No use of accessory muscles   Heart:   regular rate and rhythm  Abdomen:  soft, non-tender; bowel sounds normal; no masses,  no organomegaly  Pelvis:  Exam deferred.  Extremities: : non-tender, symmetric, 1+/1+ edema bilaterally.  DTRs: 2+/2+     Results for orders placed or performed during the hospital encounter of 04/22/21 (from the past 48 hour(s))  Basic metabolic panel     Status: Abnormal   Collection Time: 04/23/21  2:20 PM  Result Value Ref Range   Sodium 130 (L) 135 - 145 mmol/L   Potassium 3.6 3.5 - 5.1 mmol/L   Chloride 105 98 - 111 mmol/L   CO2 20 (L) 22 - 32 mmol/L   Glucose, Bld 235 (H) 70 - 99 mg/dL    Comment: Glucose reference range applies only to samples taken after fasting for at least 8 hours.   BUN 9 6 - 20 mg/dL   Creatinine, Ser 7.00 0.44 - 1.00 mg/dL   Calcium 7.7 (L) 8.9 - 10.3 mg/dL   GFR, Estimated >17 >49 mL/min    Comment: (NOTE) Calculated using the CKD-EPI Creatinine Equation (2021)    Anion gap 5 5 - 15    Comment: Performed at [facility], [address] [place], [place] [address]  Beta-hydroxybutyric acid     Status: Abnormal   Collection Time: 04/23/21  2:20 PM  Result Value Ref Range   Beta-Hydroxybutyric Acid 1.95 (H) 0.05 - 0.27 mmol/L    Comment: Performed at [facility], [address] [place], [place] [address]  Glucose, capillary     Status: Abnormal   Collection Time: 04/23/21  4:41 PM  Result Value Ref Range   Glucose-Capillary 188 (H) 70 - 99 mg/dL    Comment: Glucose reference range applies only to samples taken after fasting for at least 8 hours.  CBC     Status: Abnormal   Collection Time: 04/23/21  6:12 PM  Result Value Ref Range   WBC 10.9 (H) 4.0 - 10.5 K/uL   RBC 4.27 3.87 - 5.11 MIL/uL   Hemoglobin 12.9 12.0 - 15.0 g/dL   HCT 84.6 65.9 - 93.5 %   MCV 86.7 80.0 - 100.0 fL   MCH 30.2 26.0 - 34.0 pg   MCHC 34.9 30.0 - 36.0 g/dL   RDW 70.1 77.9 - 39.0 %   Platelets 158 150 - 400 K/uL   nRBC 0.0 0.0 - 0.2 %    Comment: Performed at [facility], [address] [place], [place] [address]  Lactic acid, plasma     Status: None   Collection Time: 04/23/21  6:12 PM  Result Value Ref Range  Lactic Acid, Venous 1.8 0.5 - 1.9 mmol/L    Comment: Performed at [facility], [address] [place], [place] [address]  Glucose, capillary     Status: Abnormal   Collection Time: 04/23/21  9:47 PM  Result Value Ref Range   Glucose-Capillary 165 (H) 70 - 99 mg/dL    Comment: Glucose reference range applies only to samples taken after fasting for at least 8 hours.  Glucose, capillary     Status: Abnormal   Collection Time: 04/24/21  7:34 AM  Result Value Ref Range   Glucose-Capillary 278 (H) 70 - 99 mg/dL    Comment: Glucose reference range applies only to samples taken after fasting for at least 8 hours.  Glucose, capillary     Status: Abnormal   Collection Time: 04/24/21 11:01 AM  Result Value Ref Range   Glucose-Capillary 179 (H) 70 - 99 mg/dL    Comment: Glucose reference range applies only to samples taken after fasting for at least 8 hours.   Comment 1 Notify RN   Glucose, capillary     Status: Abnormal   Collection Time: 04/24/21 12:29 PM  Result Value Ref Range   Glucose-Capillary 192 (H) 70 - 99 mg/dL     Comment: Glucose reference range applies only to samples taken after fasting for at least 8 hours.   Comment 1 Notify RN   Glucose, capillary     Status: Abnormal   Collection Time: 04/24/21  4:11 PM  Result Value Ref Range   Glucose-Capillary 44 (LL) 70 - 99 mg/dL    Comment: Glucose reference range applies only to samples taken after fasting for at least 8 hours.   Comment 1 Notify RN   hCG, quantitative, pregnancy     Status: Abnormal   Collection Time: 04/24/21  4:22 PM  Result Value Ref Range   hCG, Beta Chain, Quant, S 246,248 (H) <5 mIU/mL    Comment: CONFIRMED BY MANUAL DILUTION SCS          GEST. AGE      CONC.  (mIU/mL)   <=1 WEEK        5 - 50     2 WEEKS       50 - 500     3 WEEKS       100 - 10,000     4 WEEKS     1,000 - 30,000     5 WEEKS     3,500 - 115,000   6-8 WEEKS     12,000 - 270,000    12 WEEKS     15,000 - 220,000        FEMALE AND NON-PREGNANT FEMALE:     LESS THAN 5 mIU/mL Performed at [facility], [address] [place], [place] [address]   Glucose, capillary     Status: Abnormal   Collection Time: 04/24/21  4:30 PM  Result Value Ref Range   Glucose-Capillary 46 (L) 70 - 99 mg/dL    Comment: Glucose reference range applies only to samples taken after fasting for at least 8 hours.  Glucose, capillary     Status: Abnormal   Collection Time: 04/24/21  4:59 PM  Result Value Ref Range   Glucose-Capillary 177 (H) 70 - 99 mg/dL    Comment: Glucose reference range applies only to samples taken after fasting for at least 8 hours.  Glucose, capillary     Status: Abnormal   Collection Time: 04/24/21  6:06 PM  Result  Value Ref Range   Glucose-Capillary 186 (H) 70 - 99 mg/dL    Comment: Glucose reference range applies only to samples taken after fasting for at least 8 hours.  Glucose, capillary     Status: Abnormal   Collection Time: 04/24/21  8:19 PM  Result Value Ref Range   Glucose-Capillary 178 (H) 70 - 99 mg/dL    Comment: Glucose  reference range applies only to samples taken after fasting for at least 8 hours.   Comment 1 Notify RN   Glucose, capillary     Status: Abnormal   Collection Time: 04/24/21 11:51 PM  Result Value Ref Range   Glucose-Capillary 69 (L) 70 - 99 mg/dL    Comment: Glucose reference range applies only to samples taken after fasting for at least 8 hours.  Glucose, capillary     Status: Abnormal   Collection Time: 04/25/21 12:20 AM  Result Value Ref Range   Glucose-Capillary 65 (L) 70 - 99 mg/dL    Comment: Glucose reference range applies only to samples taken after fasting for at least 8 hours.   Comment 1 Notify RN   Glucose, capillary     Status: Abnormal   Collection Time: 04/25/21  1:44 AM  Result Value Ref Range   Glucose-Capillary 182 (H) 70 - 99 mg/dL    Comment: Glucose reference range applies only to samples taken after fasting for at least 8 hours.   Comment 1 Notify RN   Basic metabolic panel     Status: Abnormal   Collection Time: 04/25/21  3:06 AM  Result Value Ref Range   Sodium 134 (L) 135 - 145 mmol/L   Potassium 3.4 (L) 3.5 - 5.1 mmol/L   Chloride 105 98 - 111 mmol/L   CO2 21 (L) 22 - 32 mmol/L   Glucose, Bld 209 (H) 70 - 99 mg/dL    Comment: Glucose reference range applies only to samples taken after fasting for at least 8 hours.   BUN 7 6 - 20 mg/dL   Creatinine, Ser 0.86 0.44 - 1.00 mg/dL   Calcium 8.1 (L) 8.9 - 10.3 mg/dL   GFR, Estimated >57 >84 mL/min    Comment: (NOTE) Calculated using the CKD-EPI Creatinine Equation (2021)    Anion gap 8 5 - 15    Comment: Performed at [facility], [address] [place], [place] [address]  Magnesium     Status: None   Collection Time: 04/25/21  3:06 AM  Result Value Ref Range   Magnesium 1.7 1.7 - 2.4 mg/dL    Comment: Performed at [facility], [address] [place], [place] [address]  CBC     Status: Abnormal   Collection Time: 04/25/21  3:06 AM  Result Value Ref Range   WBC 7.2 4.0 - 10.5 K/uL    RBC 3.72 (L) 3.87 - 5.11 MIL/uL   Hemoglobin 11.6 (L) 12.0 - 15.0 g/dL   HCT 32.4 (L) 40.1 - 02.7 %   MCV 88.7 80.0 - 100.0 fL   MCH 31.2 26.0 - 34.0 pg   MCHC 35.2 30.0 - 36.0 g/dL   RDW 25.3 66.4 - 40.3 %   Platelets 218 150 - 400 K/uL   nRBC 0.0 0.0 - 0.2 %    Comment: Performed at [facility], [address] [place], [place] [address]  Glucose, capillary     Status: Abnormal   Collection Time: 04/25/21  3:28 AM  Result Value Ref Range   Glucose-Capillary 186 (  H) 70 - 99 mg/dL    Comment: Glucose reference range applies only to samples taken after fasting for at least 8 hours.   Comment 1 Notify RN   Glucose, capillary     Status: Abnormal   Collection Time: 04/25/21  7:56 AM  Result Value Ref Range   Glucose-Capillary 174 (H) 70 - 99 mg/dL    Comment: Glucose reference range applies only to samples taken after fasting for at least 8 hours.    US OB LESS THAN 14 WEEKS WITH OB TRANSVAGINAL  Result Date: 04/24/2021 CLINICAL DATA:  Gestational diabetes, pregnant, LMP 01/27/2021 EXAM: OBSTETRIC <14 WK [place] AND TRANSVAGINAL OB US TECHNIQUE: Both transabdominal and transvaginal ultrasound examinations were performed for complete evaluation of the gestation as well as the maternal uterus, adnexal regions, and pelvic cul-de-sac. Transvaginal technique was performed to assess early pregnancy. COMPARISON:  None. FINDINGS: Intrauterine gestational sac: Single Yolk sac:  Visualized. Embryo:  Visualized. Cardiac Activity: Choose Heart Rate: 178 bpm MSD: Appropriate given fetal size CRL:  24 mm   9 w   1 d                  [place] EDC: 11/26/2020 Subchorionic hemorrhage:  None visualized. Maternal uterus/adnexae: The uterus is anteverted. The cervix is unremarkable. No intrauterine masses are seen. There is mild simple appearing free fluid within the pelvis. The maternal ovaries are unremarkable. No adnexal masses are seen. IMPRESSION: Single living intrauterine gestation with an estimated  gestational age of [redacted] weeks, 1 day. No acute abnormality. Electronically Signed   By: Helyn Numbers M.D.   On: 04/24/2021 20:06    Current scheduled medications  famotidine  20 mg Oral Q12H   influenza vac split quadrivalent PF  0.5 mL Intramuscular Tomorrow-1000   insulin aspart  0-9 Units Subcutaneous Q4H   insulin detemir  20 Units Subcutaneous Daily   metoCLOPramide  10 mg Oral Q6H   Or   metoCLOPramide (REGLAN) injection  10 mg Intravenous Q6H   ondansetron (ZOFRAN) IV  4 mg Intravenous Q6H    I have reviewed the patient's current medications.  ASSESSMENT: Patient Active Problem List   Diagnosis Date Noted   Hyperemesis 04/23/2021   Hyperemesis gravidarum 04/23/2021   Type 1 diabetes mellitus with hyperglycemia (HCC) 04/22/2021   Supervision of high risk pregnancy in first trimester 04/12/2021   [redacted] weeks gestation of pregnancy    Abnormal findings on antenatal screening    Anxiety 10/18/2015    PLAN: 1. Continue routine antenatal care. -Consult in place [facility] team for management of diabetes  - Doppler completed this am  2. Hyperemesis  -Scheduled antiemetics  -Advanced to Carb modified diet -Strict I/O's -Daily weights   3. Type 1 DM with hyperglycemia  -Transitioned from insulin drip with subcutaneous insulin -Levemir increased to 30 units daily  -Q4hr CBG with sliding scale coverage while on liquid diet  -Will plan to transition to insulin pump when tolerating carb modified diet  -DM coordinator consult in place and followed by hospitalist    Haroldine Laws CNM Certified Nurse Midwife [facility]  Clinic OB/GYN [facility]

## 2021-04-26 LAB — BASIC METABOLIC PANEL
Anion gap: 8 (ref 5–15)
BUN: 8 mg/dL (ref 6–20)
CO2: 20 mmol/L — ABNORMAL LOW (ref 22–32)
Calcium: 8.5 mg/dL — ABNORMAL LOW (ref 8.9–10.3)
Chloride: 102 mmol/L (ref 98–111)
Creatinine, Ser: 0.66 mg/dL (ref 0.44–1.00)
GFR, Estimated: >60 mL/min (ref >60)
Glucose, Bld: 292 mg/dL — ABNORMAL HIGH (ref 70–99)
Potassium: 4.2 mmol/L (ref 3.5–5.1)
Sodium: 130 mmol/L — ABNORMAL LOW (ref 135–145)

## 2021-04-26 LAB — CBC
HCT: 36.6 % (ref 36.0–46.0)
Hemoglobin: 13.1 g/dL (ref 12.0–15.0)
MCH: 31.2 pg (ref 26.0–34.0)
MCHC: 35.8 g/dL (ref 30.0–36.0)
MCV: 87.1 fL (ref 80.0–100.0)
Platelets: 222 10*3/uL (ref 150–400)
RBC: 4.2 MIL/uL (ref 3.87–5.11)
RDW: 12.4 % (ref 11.5–15.5)
WBC: 7.1 10*3/uL (ref 4.0–10.5)
nRBC: 0 % (ref 0.0–0.2)

## 2021-04-26 LAB — GLUCOSE, CAPILLARY
Glucose-Capillary: 291 mg/dL — ABNORMAL HIGH (ref 70–99)
Glucose-Capillary: 418 mg/dL — ABNORMAL HIGH (ref 70–99)
Glucose-Capillary: 342 mg/dL — ABNORMAL HIGH (ref 70–99)

## 2021-04-26 LAB — MAGNESIUM: Magnesium: 1.8 mg/dL (ref 1.7–2.4)

## 2021-04-26 MED ORDER — INSULIN DETEMIR 100 UNIT/ML ~~LOC~~ SOLN
30.0000 [IU] | Freq: Every day | SUBCUTANEOUS | Status: DC
  Filled 2021-04-26 (×2): qty 0.3

## 2021-04-26 MED ORDER — FAMOTIDINE 20 MG PO TABS: 20.0000 mg | ORAL_TABLET | Freq: Two times a day (BID) | ORAL | Status: DC

## 2021-04-26 MED ORDER — METOCLOPRAMIDE HCL 10 MG PO TABS: 10.0000 mg | ORAL_TABLET | Freq: Four times a day (QID) | ORAL | 3 refills | Status: DC

## 2021-04-26 MED ORDER — ACETAMINOPHEN 325 MG PO TABS: 650.0000 mg | ORAL_TABLET | ORAL | Status: DC | PRN

## 2021-04-26 NOTE — Discharge Summary (Signed)
Patient ID: Cheryl Avila MRN: [id number] DOB/AGE: 07-Jul-1995 25 y.o.  Admit date: 04/22/2021 Discharge date: 04/26/2021  Admission Diagnoses:  Discharge Diagnoses:   Prenatal Care Site: [facility]  Prenatal Procedures: ultrasound  Consults:Maternal Fetal Medicine  Significant Diagnostic Studies:  Results for orders placed or performed during the hospital encounter of 04/22/21 (from the past 168 hour(s))  CBC with Differential   Collection Time: 04/22/21 12:51 PM  Result Value Ref Range   WBC 16.4 (H) 4.0 - 10.5 K/uL   RBC 4.71 3.87 - 5.11 MIL/uL   Hemoglobin 14.6 12.0 - 15.0 g/dL   HCT 40.9 81.1 - 91.4 %   MCV 88.1 80.0 - 100.0 fL   MCH 31.0 26.0 - 34.0 pg   MCHC 35.2 30.0 - 36.0 g/dL   RDW 78.2 95.6 - 21.3 %   Platelets 319 150 - 400 K/uL   nRBC 0.0 0.0 - 0.2 %   Neutrophils Relative % 90 %   Neutro Abs 14.5 (H) 1.7 - 7.7 K/uL   Lymphocytes Relative 8 %   Lymphs Abs 1.4 0.7 - 4.0 K/uL   Monocytes Relative 2 %   Monocytes Absolute 0.4 0.1 - 1.0 K/uL   Eosinophils Relative 0 %   Eosinophils Absolute 0.0 0.0 - 0.5 K/uL   Basophils Relative 0 %   Basophils Absolute 0.1 0.0 - 0.1 K/uL   Immature Granulocytes 0 %   Abs Immature Granulocytes 0.07 0.00 - 0.07 K/uL  Comprehensive metabolic panel   Collection Time: 04/22/21 12:51 PM  Result Value Ref Range   Sodium 129 (L) 135 - 145 mmol/L   Potassium 4.5 3.5 - 5.1 mmol/L   Chloride 103 98 - 111 mmol/L   CO2 14 (L) 22 - 32 mmol/L   Glucose, Bld 300 (H) 70 - 99 mg/dL   BUN 16 6 - 20 mg/dL   Creatinine, Ser 0.86 0.44 - 1.00 mg/dL   Calcium 9.2 8.9 - 57.8 mg/dL   Total Protein 8.1 6.5 - 8.1 g/dL   Albumin 4.1 3.5 - 5.0 g/dL   AST 27 15 - 41 U/L   ALT 21 0 - 44 U/L   Alkaline Phosphatase 68 38 - 126 U/L   Total Bilirubin 2.4 (H) 0.3 - 1.2 mg/dL   GFR, Estimated >46 >96 mL/min   Anion gap 12 5 - 15  Blood gas, venous   Collection Time: 04/22/21 12:51 PM  Result Value Ref Range   pH, Ven 7.29 7.250  - 7.430   pCO2, Ven 33 (L) 44.0 - 60.0 mmHg   pO2, Ven 50.0 (H) 32.0 - 45.0 mmHg   Bicarbonate 15.9 (L) 20.0 - 28.0 mmol/L   Acid-base deficit 9.6 (H) 0.0 - 2.0 mmol/L   O2 Saturation 80.0 %   Patient temperature 37.0    Collection site VEIN    Sample type VEIN   Lactic acid, plasma   Collection Time: 04/22/21 12:51 PM  Result Value Ref Range   Lactic Acid, Venous 2.8 (HH) 0.5 - 1.9 mmol/L  Urinalysis, Routine w reflex microscopic Urine, Clean Catch   Collection Time: 04/22/21 12:52 PM  Result Value Ref Range   Color, Urine YELLOW (A) YELLOW   APPearance CLEAR (A) CLEAR   Specific Gravity, Urine 1.023 1.005 - 1.030   pH 5.0 5.0 - 8.0   Glucose, UA >=500 (A) NEGATIVE mg/dL   Hgb urine dipstick NEGATIVE NEGATIVE   Bilirubin Urine NEGATIVE NEGATIVE   Ketones, ur 80 (A) NEGATIVE mg/dL  Protein, ur NEGATIVE NEGATIVE mg/dL   Nitrite NEGATIVE NEGATIVE   Leukocytes,Ua NEGATIVE NEGATIVE   RBC / HPF 0-5 0 - 5 RBC/hpf   WBC, UA 0-5 0 - 5 WBC/hpf   Bacteria, UA FEW (A) NONE SEEN   Squamous Epithelial / LPF 0-5 0 - 5   Mucus PRESENT    Hyaline Casts, UA PRESENT   Beta-hydroxybutyric acid   Collection Time: 04/22/21  2:00 PM  Result Value Ref Range   Beta-Hydroxybutyric Acid 1.82 (H) 0.05 - 0.27 mmol/L  Resp Panel by RT-PCR (Flu A&B, Covid) Nasopharyngeal Swab   Collection Time: 04/22/21  2:13 PM   Specimen: Nasopharyngeal Swab; Nasopharyngeal(NP) swabs in vial transport medium  Result Value Ref Range   SARS Coronavirus 2 by RT PCR NEGATIVE NEGATIVE   Influenza A by PCR NEGATIVE NEGATIVE   Influenza B by PCR NEGATIVE NEGATIVE  POC CBG, ED   Collection Time: 04/22/21  3:07 PM  Result Value Ref Range   Glucose-Capillary 224 (H) 70 - 99 mg/dL  Basic metabolic panel   Collection Time: 04/22/21  3:10 PM  Result Value Ref Range   Sodium 132 (L) 135 - 145 mmol/L   Potassium 4.4 3.5 - 5.1 mmol/L   Chloride 106 98 - 111 mmol/L   CO2 17 (L) 22 - 32 mmol/L   Glucose, Bld 216 (H) 70 -  99 mg/dL   BUN 15 6 - 20 mg/dL   Creatinine, Ser 1.88 0.44 - 1.00 mg/dL   Calcium 8.3 (L) 8.9 - 10.3 mg/dL   GFR, Estimated >41 >66 mL/min   Anion gap 9 5 - 15  CBG monitoring, ED   Collection Time: 04/22/21  5:34 PM  Result Value Ref Range   Glucose-Capillary 196 (H) 70 - 99 mg/dL  Hemoglobin A6T   Collection Time: 04/22/21  5:50 PM  Result Value Ref Range   Hgb A1c MFr Bld 5.9 (H) 4.8 - 5.6 %   Mean Plasma Glucose 123 mg/dL  Comprehensive metabolic panel   Collection Time: 04/22/21  5:50 PM  Result Value Ref Range   Sodium 132 (L) 135 - 145 mmol/L   Potassium 3.8 3.5 - 5.1 mmol/L   Chloride 107 98 - 111 mmol/L   CO2 18 (L) 22 - 32 mmol/L   Glucose, Bld 236 (H) 70 - 99 mg/dL   BUN 13 6 - 20 mg/dL   Creatinine, Ser 0.16 0.44 - 1.00 mg/dL   Calcium 8.3 (L) 8.9 - 10.3 mg/dL   Total Protein 6.5 6.5 - 8.1 g/dL   Albumin 3.3 (L) 3.5 - 5.0 g/dL   AST 16 15 - 41 U/L   ALT 18 0 - 44 U/L   Alkaline Phosphatase 54 38 - 126 U/L   Total Bilirubin 1.6 (H) 0.3 - 1.2 mg/dL   GFR, Estimated >01 >09 mL/min   Anion gap 7 5 - 15  CBG monitoring, ED   Collection Time: 04/22/21  6:37 PM  Result Value Ref Range   Glucose-Capillary 200 (H) 70 - 99 mg/dL  CBG monitoring, ED   Collection Time: 04/22/21  8:03 PM  Result Value Ref Range   Glucose-Capillary 161 (H) 70 - 99 mg/dL   Comment 1 Document in Chart   Lactic acid, plasma   Collection Time: 04/22/21  8:04 PM  Result Value Ref Range   Lactic Acid, Venous 1.5 0.5 - 1.9 mmol/L  Basic metabolic panel   Collection Time: 04/22/21  8:04 PM  Result Value Ref Range  Sodium 132 (L) 135 - 145 mmol/L   Potassium 3.7 3.5 - 5.1 mmol/L   Chloride 108 98 - 111 mmol/L   CO2 19 (L) 22 - 32 mmol/L   Glucose, Bld 158 (H) 70 - 99 mg/dL   BUN 13 6 - 20 mg/dL   Creatinine, Ser 4.09 0.44 - 1.00 mg/dL   Calcium 8.6 (L) 8.9 - 10.3 mg/dL   GFR, Estimated >73 >53 mL/min   Anion gap 5 5 - 15  Beta-hydroxybutyric acid   Collection Time: 04/22/21  8:04 PM   Result Value Ref Range   Beta-Hydroxybutyric Acid 1.53 (H) 0.05 - 0.27 mmol/L  CBG monitoring, ED   Collection Time: 04/22/21 10:00 PM  Result Value Ref Range   Glucose-Capillary 115 (H) 70 - 99 mg/dL   Comment 1 Document in Chart   CBG monitoring, ED   Collection Time: 04/23/21 12:19 AM  Result Value Ref Range   Glucose-Capillary 149 (H) 70 - 99 mg/dL   Comment 1 Document in Chart   Basic metabolic panel   Collection Time: 04/23/21 12:20 AM  Result Value Ref Range   Sodium 132 (L) 135 - 145 mmol/L   Potassium 4.0 3.5 - 5.1 mmol/L   Chloride 106 98 - 111 mmol/L   CO2 20 (L) 22 - 32 mmol/L   Glucose, Bld 143 (H) 70 - 99 mg/dL   BUN 12 6 - 20 mg/dL   Creatinine, Ser 2.99 0.44 - 1.00 mg/dL   Calcium 8.1 (L) 8.9 - 10.3 mg/dL   GFR, Estimated >24 >26 mL/min   Anion gap 6 5 - 15  CBG monitoring, ED   Collection Time: 04/23/21  2:02 AM  Result Value Ref Range   Glucose-Capillary 180 (H) 70 - 99 mg/dL   Comment 1 Document in Chart   CBG monitoring, ED   Collection Time: 04/23/21  3:51 AM  Result Value Ref Range   Glucose-Capillary 108 (H) 70 - 99 mg/dL   Comment 1 Document in Chart   Basic metabolic panel   Collection Time: 04/23/21  5:21 AM  Result Value Ref Range   Sodium 133 (L) 135 - 145 mmol/L   Potassium 3.6 3.5 - 5.1 mmol/L   Chloride 105 98 - 111 mmol/L   CO2 21 (L) 22 - 32 mmol/L   Glucose, Bld 162 (H) 70 - 99 mg/dL   BUN 12 6 - 20 mg/dL   Creatinine, Ser 8.34 0.44 - 1.00 mg/dL   Calcium 8.3 (L) 8.9 - 10.3 mg/dL   GFR, Estimated >19 >62 mL/min   Anion gap 7 5 - 15  Beta-hydroxybutyric acid   Collection Time: 04/23/21  5:21 AM  Result Value Ref Range   Beta-Hydroxybutyric Acid 1.00 (H) 0.05 - 0.27 mmol/L  CBG monitoring, ED   Collection Time: 04/23/21  6:06 AM  Result Value Ref Range   Glucose-Capillary 190 (H) 70 - 99 mg/dL   Comment 1 Document in Chart   CBG monitoring, ED   Collection Time: 04/23/21  7:26 AM  Result Value Ref Range   Glucose-Capillary  184 (H) 70 - 99 mg/dL  CBG monitoring, ED   Collection Time: 04/23/21  8:58 AM  Result Value Ref Range   Glucose-Capillary 127 (H) 70 - 99 mg/dL  Basic metabolic panel   Collection Time: 04/23/21  2:20 PM  Result Value Ref Range   Sodium 130 (L) 135 - 145 mmol/L   Potassium 3.6 3.5 - 5.1 mmol/L   Chloride 105 98 -  111 mmol/L   CO2 20 (L) 22 - 32 mmol/L   Glucose, Bld 235 (H) 70 - 99 mg/dL   BUN 9 6 - 20 mg/dL   Creatinine, Ser 5.62 0.44 - 1.00 mg/dL   Calcium 7.7 (L) 8.9 - 10.3 mg/dL   GFR, Estimated >56 >38 mL/min   Anion gap 5 5 - 15  Beta-hydroxybutyric acid   Collection Time: 04/23/21  2:20 PM  Result Value Ref Range   Beta-Hydroxybutyric Acid 1.95 (H) 0.05 - 0.27 mmol/L  Glucose, capillary   Collection Time: 04/23/21  4:41 PM  Result Value Ref Range   Glucose-Capillary 188 (H) 70 - 99 mg/dL  CBC   Collection Time: 04/23/21  6:12 PM  Result Value Ref Range   WBC 10.9 (H) 4.0 - 10.5 K/uL   RBC 4.27 3.87 - 5.11 MIL/uL   Hemoglobin 12.9 12.0 - 15.0 g/dL   HCT 93.7 34.2 - 87.6 %   MCV 86.7 80.0 - 100.0 fL   MCH 30.2 26.0 - 34.0 pg   MCHC 34.9 30.0 - 36.0 g/dL   RDW 81.1 57.2 - 62.0 %   Platelets 158 150 - 400 K/uL   nRBC 0.0 0.0 - 0.2 %  Lactic acid, plasma   Collection Time: 04/23/21  6:12 PM  Result Value Ref Range   Lactic Acid, Venous 1.8 0.5 - 1.9 mmol/L  Glucose, capillary   Collection Time: 04/23/21  9:47 PM  Result Value Ref Range   Glucose-Capillary 165 (H) 70 - 99 mg/dL  Glucose, capillary   Collection Time: 04/24/21  7:34 AM  Result Value Ref Range   Glucose-Capillary 278 (H) 70 - 99 mg/dL  Glucose, capillary   Collection Time: 04/24/21 11:01 AM  Result Value Ref Range   Glucose-Capillary 179 (H) 70 - 99 mg/dL   Comment 1 Notify RN   Glucose, capillary   Collection Time: 04/24/21 12:29 PM  Result Value Ref Range   Glucose-Capillary 192 (H) 70 - 99 mg/dL   Comment 1 Notify RN   Glucose, capillary   Collection Time: 04/24/21  4:11 PM  Result  Value Ref Range   Glucose-Capillary 44 (LL) 70 - 99 mg/dL   Comment 1 Notify RN   hCG, quantitative, pregnancy   Collection Time: 04/24/21  4:22 PM  Result Value Ref Range   hCG, Beta Chain, Quant, S 246,248 (H) <5 mIU/mL  Glucose, capillary   Collection Time: 04/24/21  4:30 PM  Result Value Ref Range   Glucose-Capillary 46 (L) 70 - 99 mg/dL  Glucose, capillary   Collection Time: 04/24/21  4:59 PM  Result Value Ref Range   Glucose-Capillary 177 (H) 70 - 99 mg/dL  Glucose, capillary   Collection Time: 04/24/21  6:06 PM  Result Value Ref Range   Glucose-Capillary 186 (H) 70 - 99 mg/dL  Glucose, capillary   Collection Time: 04/24/21  8:19 PM  Result Value Ref Range   Glucose-Capillary 178 (H) 70 - 99 mg/dL   Comment 1 Notify RN   Glucose, capillary   Collection Time: 04/24/21 11:51 PM  Result Value Ref Range   Glucose-Capillary 69 (L) 70 - 99 mg/dL  Glucose, capillary   Collection Time: 04/25/21 12:20 AM  Result Value Ref Range   Glucose-Capillary 65 (L) 70 - 99 mg/dL   Comment 1 Notify RN   Glucose, capillary   Collection Time: 04/25/21  1:44 AM  Result Value Ref Range   Glucose-Capillary 182 (H) 70 - 99 mg/dL   Comment 1  Notify RN   Basic metabolic panel   Collection Time: 04/25/21  3:06 AM  Result Value Ref Range   Sodium 134 (L) 135 - 145 mmol/L   Potassium 3.4 (L) 3.5 - 5.1 mmol/L   Chloride 105 98 - 111 mmol/L   CO2 21 (L) 22 - 32 mmol/L   Glucose, Bld 209 (H) 70 - 99 mg/dL   BUN 7 6 - 20 mg/dL   Creatinine, Ser 6.27 0.44 - 1.00 mg/dL   Calcium 8.1 (L) 8.9 - 10.3 mg/dL   GFR, Estimated >03 >50 mL/min   Anion gap 8 5 - 15  Magnesium   Collection Time: 04/25/21  3:06 AM  Result Value Ref Range   Magnesium 1.7 1.7 - 2.4 mg/dL  CBC   Collection Time: 04/25/21  3:06 AM  Result Value Ref Range   WBC 7.2 4.0 - 10.5 K/uL   RBC 3.72 (L) 3.87 - 5.11 MIL/uL   Hemoglobin 11.6 (L) 12.0 - 15.0 g/dL   HCT 09.3 (L) 81.8 - 29.9 %   MCV 88.7 80.0 - 100.0 fL   MCH 31.2  26.0 - 34.0 pg   MCHC 35.2 30.0 - 36.0 g/dL   RDW 37.1 69.6 - 78.9 %   Platelets 218 150 - 400 K/uL   nRBC 0.0 0.0 - 0.2 %  Glucose, capillary   Collection Time: 04/25/21  3:28 AM  Result Value Ref Range   Glucose-Capillary 186 (H) 70 - 99 mg/dL   Comment 1 Notify RN   Glucose, capillary   Collection Time: 04/25/21  7:56 AM  Result Value Ref Range   Glucose-Capillary 174 (H) 70 - 99 mg/dL  Glucose, capillary   Collection Time: 04/25/21 12:21 PM  Result Value Ref Range   Glucose-Capillary 232 (H) 70 - 99 mg/dL  Glucose, capillary   Collection Time: 04/25/21  4:50 PM  Result Value Ref Range   Glucose-Capillary 311 (H) 70 - 99 mg/dL  Glucose, capillary   Collection Time: 04/25/21  6:30 PM  Result Value Ref Range   Glucose-Capillary 222 (H) 70 - 99 mg/dL   Comment 1 Notify RN   Glucose, capillary   Collection Time: 04/25/21 10:07 PM  Result Value Ref Range   Glucose-Capillary 123 (H) 70 - 99 mg/dL  Basic metabolic panel   Collection Time: 04/26/21  8:04 AM  Result Value Ref Range   Sodium 130 (L) 135 - 145 mmol/L   Potassium 4.2 3.5 - 5.1 mmol/L   Chloride 102 98 - 111 mmol/L   CO2 20 (L) 22 - 32 mmol/L   Glucose, Bld 292 (H) 70 - 99 mg/dL   BUN 8 6 - 20 mg/dL   Creatinine, Ser 3.81 0.44 - 1.00 mg/dL   Calcium 8.5 (L) 8.9 - 10.3 mg/dL   GFR, Estimated >01 >75 mL/min   Anion gap 8 5 - 15  Magnesium   Collection Time: 04/26/21  8:04 AM  Result Value Ref Range   Magnesium 1.8 1.7 - 2.4 mg/dL  CBC   Collection Time: 04/26/21  8:04 AM  Result Value Ref Range   WBC 7.1 4.0 - 10.5 K/uL   RBC 4.20 3.87 - 5.11 MIL/uL   Hemoglobin 13.1 12.0 - 15.0 g/dL   HCT 10.2 58.5 - 27.7 %   MCV 87.1 80.0 - 100.0 fL   MCH 31.2 26.0 - 34.0 pg   MCHC 35.8 30.0 - 36.0 g/dL   RDW 82.4 23.5 - 36.1 %   Platelets 222 150 -  400 K/uL   nRBC 0.0 0.0 - 0.2 %  Glucose, capillary   Collection Time: 04/26/21  8:29 AM  Result Value Ref Range   Glucose-Capillary 291 (H) 70 - 99 mg/dL   Comment  1 Notify RN   Glucose, capillary   Collection Time: 04/26/21 11:29 AM  Result Value Ref Range   Glucose-Capillary 418 (H) 70 - 99 mg/dL  Glucose, capillary   Collection Time: 04/26/21 12:34 PM  Result Value Ref Range   Glucose-Capillary 342 (H) 70 - 99 mg/dL   Comment 1 Notify RN     Treatments: IV hydration, insulin: Novolog and Levemir, and IV antemetics  Hospital Course:  This is a 25 y.o. [id number] with IUP at [redacted]w[redacted]d admitted for N/V in pregnancy and DKA,   No leaking of fluid and no bleeding.  On 04/22/21 She was initially transitioned from insulin pump to subcutaneous insulin and antiemetics until she was able to tolerate PO intake. On 04/26/21 Patient was transitioned back to her insulin pump after being able to tolerate food without nausea. Her blood glucose levels are trending down at this time. She was deemed stable for discharge to home with outpatient follow up.  Discharge Physical Exam:  BP 129/86 (BP Location: Left Arm)   Pulse 98   Temp 98 F (36.7 C) (Oral)   Resp 18   Ht 5' (1.524 m)   Wt 96.7 kg   LMP 01/27/2021 (Approximate)   SpO2 99%   BMI 41.65 kg/m   General: NAD CV: RRR Pulm: CTABL, nl effort ABD: s/nd/nt, gravid DVT Evaluation: LE non-ttp, no evidence of DVT on exam.   Discharge Condition: Stable PLAN: -Per diabetic coordinator, Patient to restart insulin pump at current settings. -patient to contact [name] office for insulin pump followup if office does contact her -Hypoglycemic protocol reviewed, patient verbalized understanding -Patient sent home with PO Pepcid and Reglan  -patient to f/u with scheduled MFM  appointment -patient to f/u with scheduled OB appointment  Disposition: Discharge disposition: 01-Home or Self Care    Home to self care   Allergies as of 04/26/2021       Reactions   Aleve [naproxen] Hives        Medication List     STOP taking these medications    HYDROcodone-acetaminophen 5-325 MG  tablet Commonly known as: NORCO/VICODIN   ibuprofen 600 MG tablet Commonly known as: ADVIL       TAKE these medications    acetaminophen 325 MG tablet Commonly known as: TYLENOL Take 2 tablets (650 mg total) by mouth every 4 (four) hours as needed (for pain scale < 4  OR  temperature  >/=  100.5 F).   famotidine 20 MG tablet Commonly known as: PEPCID Take 1 tablet (20 mg total) by mouth every 12 (twelve) hours. What changed: when to take this   insulin aspart 100 UNIT/ML FlexPen Commonly known as: NOVOLOG Inject 0-100 Units into the skin as directed. Via insulin pump   metoCLOPramide 10 MG tablet Commonly known as: REGLAN Take 1 tablet (10 mg total) by mouth every 6 (six) hours.   ondansetron 4 MG disintegrating tablet Commonly known as: Zofran ODT Take 1 tablet (4 mg total) by mouth every 8 (eight) hours as needed for nausea or vomiting.   Prenatal 28-0.8 MG Tabs Take 1 tablet by mouth daily.        Follow-up Information     Haroldine Laws, CNM Follow up in 14 day(s).   Specialty: Certified  Nurse Midwife Why: as scheduled for [facility] Contact information: [address] [place] [address] [phone]         [facility] Follow up in 2 day(s).   Specialty: Perinatology Contact information: [address] [place] [address] [phone]                Signed:  ----- Chari Manning, CNM Certified Nurse Midwife [facility]

## 2021-04-26 NOTE — Progress Notes (Signed)
ANTEPARTUM PROGRESS NOTE  Cheryl Avila is a 25 y.o. [id number] at [redacted]w[redacted]d who is admitted for N/V in pregnancy and DKA.   Estimated Date of Delivery: 11/25/21  Length of Stay:  3 Days. Admitted 04/22/2021  Subjective: -able to tolerate regular diet without nausea  She reports:  -no leakage of fluid -no vaginal bleeding -no contractions  Vitals:  BP 129/86 (BP Location: Left Arm)   Pulse 98   Temp 98 F (36.7 C) (Oral)   Resp 18   Ht 5' (1.524 m)   Wt 96.7 kg   LMP 01/27/2021 (Approximate)   SpO2 99%   BMI 41.65 kg/m  Physical Exam Constitutional:      General: She is awake.     Appearance: Normal appearance. She is obese.  HENT:     Head: Normocephalic.     Nose: Nose normal.     Mouth/Throat:     Mouth: Mucous membranes are moist.  Cardiovascular:     Rate and Rhythm: Normal rate.  Pulmonary:     Effort: Pulmonary effort is normal.  Abdominal:     Palpations: Abdomen is soft.     Tenderness: There is no abdominal tenderness.     Comments: gravid  Musculoskeletal:        General: Normal range of motion.     Cervical back: Normal range of motion and neck supple.  Neurological:     Mental Status: She is alert and oriented to person, place, and time.  Skin:    General: Skin is warm and dry.  Psychiatric:        Mood and Affect: Mood normal.        Behavior: Behavior normal. Behavior is cooperative.        Thought Content: Thought content normal.        Judgment: Judgment normal.  Vitals reviewed.     Results for orders placed or performed during the hospital encounter of 04/22/21 (from the past 48 hour(s))  Glucose, capillary     Status: Abnormal   Collection Time: 04/24/21  4:11 PM  Result Value Ref Range   Glucose-Capillary 44 (LL) 70 - 99 mg/dL    Comment: Glucose reference range applies only to samples taken after fasting for at least 8 hours.   Comment 1 Notify RN   hCG, quantitative, pregnancy     Status: Abnormal   Collection Time: 04/24/21  4:22  PM  Result Value Ref Range   hCG, Beta Chain, Quant, S 246,248 (H) <5 mIU/mL    Comment: CONFIRMED BY MANUAL DILUTION SCS          GEST. AGE      CONC.  (mIU/mL)   <=1 WEEK        5 - 50     2 WEEKS       50 - 500     3 WEEKS       100 - 10,000     4 WEEKS     1,000 - 30,000     5 WEEKS     3,500 - 115,000   6-8 WEEKS     12,000 - 270,000    12 WEEKS     15,000 - 220,000        FEMALE AND NON-PREGNANT FEMALE:     LESS THAN 5 mIU/mL Performed at [facility], [address] [place], [place] [address]   Glucose, capillary     Status: Abnormal   Collection Time: 04/24/21  4:30 PM  Result Value Ref Range   Glucose-Capillary 46 (L) 70 - 99 mg/dL    Comment: Glucose reference range applies only to samples taken after fasting for at least 8 hours.  Glucose, capillary     Status: Abnormal   Collection Time: 04/24/21  4:59 PM  Result Value Ref Range   Glucose-Capillary 177 (H) 70 - 99 mg/dL    Comment: Glucose reference range applies only to samples taken after fasting for at least 8 hours.  Glucose, capillary     Status: Abnormal   Collection Time: 04/24/21  6:06 PM  Result Value Ref Range   Glucose-Capillary 186 (H) 70 - 99 mg/dL    Comment: Glucose reference range applies only to samples taken after fasting for at least 8 hours.  Glucose, capillary     Status: Abnormal   Collection Time: 04/24/21  8:19 PM  Result Value Ref Range   Glucose-Capillary 178 (H) 70 - 99 mg/dL    Comment: Glucose reference range applies only to samples taken after fasting for at least 8 hours.   Comment 1 Notify RN   Glucose, capillary     Status: Abnormal   Collection Time: 04/24/21 11:51 PM  Result Value Ref Range   Glucose-Capillary 69 (L) 70 - 99 mg/dL    Comment: Glucose reference range applies only to samples taken after fasting for at least 8 hours.  Glucose, capillary     Status: Abnormal   Collection Time: 04/25/21 12:20 AM  Result Value Ref Range   Glucose-Capillary 65 (L) 70 - 99  mg/dL    Comment: Glucose reference range applies only to samples taken after fasting for at least 8 hours.   Comment 1 Notify RN   Glucose, capillary     Status: Abnormal   Collection Time: 04/25/21  1:44 AM  Result Value Ref Range   Glucose-Capillary 182 (H) 70 - 99 mg/dL    Comment: Glucose reference range applies only to samples taken after fasting for at least 8 hours.   Comment 1 Notify RN   Basic metabolic panel     Status: Abnormal   Collection Time: 04/25/21  3:06 AM  Result Value Ref Range   Sodium 134 (L) 135 - 145 mmol/L   Potassium 3.4 (L) 3.5 - 5.1 mmol/L   Chloride 105 98 - 111 mmol/L   CO2 21 (L) 22 - 32 mmol/L   Glucose, Bld 209 (H) 70 - 99 mg/dL    Comment: Glucose reference range applies only to samples taken after fasting for at least 8 hours.   BUN 7 6 - 20 mg/dL   Creatinine, Ser 0.34 0.44 - 1.00 mg/dL   Calcium 8.1 (L) 8.9 - 10.3 mg/dL   GFR, Estimated >91 >79 mL/min    Comment: (NOTE) Calculated using the CKD-EPI Creatinine Equation (2021)    Anion gap 8 5 - 15    Comment: Performed at [facility], [address] [place], [place] [address]  Magnesium     Status: None   Collection Time: 04/25/21  3:06 AM  Result Value Ref Range   Magnesium 1.7 1.7 - 2.4 mg/dL    Comment: Performed at [facility], [address] [place], [place] [address]  CBC     Status: Abnormal   Collection Time: 04/25/21  3:06 AM  Result Value Ref Range   WBC 7.2 4.0 - 10.5 K/uL   RBC 3.72 (L) 3.87 - 5.11 MIL/uL   Hemoglobin 11.6 (L)  12.0 - 15.0 g/dL   HCT 29.5 (L) 62.1 - 30.8 %   MCV 88.7 80.0 - 100.0 fL   MCH 31.2 26.0 - 34.0 pg   MCHC 35.2 30.0 - 36.0 g/dL   RDW 65.7 84.6 - 96.2 %   Platelets 218 150 - 400 K/uL   nRBC 0.0 0.0 - 0.2 %    Comment: Performed at [facility], [address] [place], [place] [address]  Glucose, capillary     Status: Abnormal   Collection Time: 04/25/21  3:28 AM  Result Value Ref Range   Glucose-Capillary 186  (H) 70 - 99 mg/dL    Comment: Glucose reference range applies only to samples taken after fasting for at least 8 hours.   Comment 1 Notify RN   Glucose, capillary     Status: Abnormal   Collection Time: 04/25/21  7:56 AM  Result Value Ref Range   Glucose-Capillary 174 (H) 70 - 99 mg/dL    Comment: Glucose reference range applies only to samples taken after fasting for at least 8 hours.  Glucose, capillary     Status: Abnormal   Collection Time: 04/25/21 12:21 PM  Result Value Ref Range   Glucose-Capillary 232 (H) 70 - 99 mg/dL    Comment: Glucose reference range applies only to samples taken after fasting for at least 8 hours.  Glucose, capillary     Status: Abnormal   Collection Time: 04/25/21  4:50 PM  Result Value Ref Range   Glucose-Capillary 311 (H) 70 - 99 mg/dL    Comment: Glucose reference range applies only to samples taken after fasting for at least 8 hours.  Glucose, capillary     Status: Abnormal   Collection Time: 04/25/21  6:30 PM  Result Value Ref Range   Glucose-Capillary 222 (H) 70 - 99 mg/dL    Comment: Glucose reference range applies only to samples taken after fasting for at least 8 hours.   Comment 1 Notify RN   Glucose, capillary     Status: Abnormal   Collection Time: 04/25/21 10:07 PM  Result Value Ref Range   Glucose-Capillary 123 (H) 70 - 99 mg/dL    Comment: Glucose reference range applies only to samples taken after fasting for at least 8 hours.  Basic metabolic panel     Status: Abnormal   Collection Time: 04/26/21  8:04 AM  Result Value Ref Range   Sodium 130 (L) 135 - 145 mmol/L   Potassium 4.2 3.5 - 5.1 mmol/L   Chloride 102 98 - 111 mmol/L   CO2 20 (L) 22 - 32 mmol/L   Glucose, Bld 292 (H) 70 - 99 mg/dL    Comment: Glucose reference range applies only to samples taken after fasting for at least 8 hours.   BUN 8 6 - 20 mg/dL   Creatinine, Ser 1.32 0.44 - 1.00 mg/dL   Calcium 8.5 (L) 8.9 - 10.3 mg/dL   GFR, Estimated >44 >01 mL/min    Comment:  (NOTE) Calculated using the CKD-EPI Creatinine Equation (2021)    Anion gap 8 5 - 15    Comment: Performed at [facility], [address] [place], [place] [address]  Magnesium     Status: None   Collection Time: 04/26/21  8:04 AM  Result Value Ref Range   Magnesium 1.8 1.7 - 2.4 mg/dL    Comment: Performed at [facility], [address], [place], [place] [address]  CBC     Status: None  Collection Time: 04/26/21  8:04 AM  Result Value Ref Range   WBC 7.1 4.0 - 10.5 K/uL   RBC 4.20 3.87 - 5.11 MIL/uL   Hemoglobin 13.1 12.0 - 15.0 g/dL   HCT 16.1 09.6 - 04.5 %   MCV 87.1 80.0 - 100.0 fL   MCH 31.2 26.0 - 34.0 pg   MCHC 35.8 30.0 - 36.0 g/dL   RDW 40.9 81.1 - 91.4 %   Platelets 222 150 - 400 K/uL   nRBC 0.0 0.0 - 0.2 %    Comment: Performed at [facility], [address] [place], [place] [address]  Glucose, capillary     Status: Abnormal   Collection Time: 04/26/21  8:29 AM  Result Value Ref Range   Glucose-Capillary 291 (H) 70 - 99 mg/dL    Comment: Glucose reference range applies only to samples taken after fasting for at least 8 hours.   Comment 1 Notify RN   Glucose, capillary     Status: Abnormal   Collection Time: 04/26/21 11:29 AM  Result Value Ref Range   Glucose-Capillary 418 (H) 70 - 99 mg/dL    Comment: Glucose reference range applies only to samples taken after fasting for at least 8 hours.  Glucose, capillary     Status: Abnormal   Collection Time: 04/26/21 12:34 PM  Result Value Ref Range   Glucose-Capillary 342 (H) 70 - 99 mg/dL    Comment: Glucose reference range applies only to samples taken after fasting for at least 8 hours.   Comment 1 Notify RN     US OB LESS THAN 14 WEEKS WITH OB TRANSVAGINAL  Result Date: 04/24/2021 CLINICAL DATA:  Gestational diabetes, pregnant, LMP 01/27/2021 EXAM: OBSTETRIC <14 WK [place] AND TRANSVAGINAL OB US TECHNIQUE: Both transabdominal and transvaginal ultrasound examinations were performed for  complete evaluation of the gestation as well as the maternal uterus, adnexal regions, and pelvic cul-de-sac. Transvaginal technique was performed to assess early pregnancy. COMPARISON:  None. FINDINGS: Intrauterine gestational sac: Single Yolk sac:  Visualized. Embryo:  Visualized. Cardiac Activity: Choose Heart Rate: 178 bpm MSD: Appropriate given fetal size CRL:  24 mm   9 w   1 d                  [place] EDC: 11/26/2020 Subchorionic hemorrhage:  None visualized. Maternal uterus/adnexae: The uterus is anteverted. The cervix is unremarkable. No intrauterine masses are seen. There is mild simple appearing free fluid within the pelvis. The maternal ovaries are unremarkable. No adnexal masses are seen. IMPRESSION: Single living intrauterine gestation with an estimated gestational age of [redacted] weeks, 1 day. No acute abnormality. Electronically Signed   By: Helyn Numbers M.D.   On: 04/24/2021 20:06    Current scheduled medications  famotidine  20 mg Oral Q12H   influenza vac split quadrivalent PF  0.5 mL Intramuscular Tomorrow-1000   insulin aspart  0-9 Units Subcutaneous TID WC   insulin aspart  5 Units Subcutaneous TID WC   insulin detemir  30 Units Subcutaneous Daily   metoCLOPramide  10 mg Oral Q6H   Or   metoCLOPramide (REGLAN) injection  10 mg Intravenous Q6H   ondansetron (ZOFRAN) IV  4 mg Intravenous Q6H    I have reviewed the patient's current medications.  ASSESSMENT: Patient Active Problem List   Diagnosis Date Noted   Hyperemesis 04/23/2021   Hyperemesis gravidarum 04/23/2021   Type 1 diabetes mellitus with hyperglycemia (HCC) 04/22/2021   Supervision of high risk pregnancy  in first trimester 04/12/2021   [redacted] weeks gestation of pregnancy    Abnormal findings on antenatal screening    Anxiety 10/18/2015    PLAN: -Per diabetic coordinator, Patient to restart insulin pump at current settings. -patient to contact [name] office for insulin pump followup if office does contact  her -Hypoglycemic protocol reviewed, patient verbalized understanding -Patient sent home with PO Pepcid and Reglan    Chari Manning CNM Certified Nurse Midwife [facility]

## 2021-04-26 NOTE — Progress Notes (Signed)
Inpatient Diabetes Program Recommendations  ADA Standards of Care 2022 Diabetes in Pregnancy Target Glucose Ranges:  Fasting: 60 - 90 mg/dL Preprandial: 60 - 606 mg/dL 1 hr postprandial: Less than 140mg /dL (from first bite of meal) 2 hr postprandial: Less than 120 mg/dL (from first bit of meal)    Lab Results  Component Value Date   GLUCAP 291 (H) 04/26/2021   HGBA1C 5.9 (H) 04/22/2021    Review of Glycemic Control Results for VALECIA, BESKE (MRN Meriel Flavors) as of 04/26/2021 08:53  Ref. Range 04/25/2021 07:56 04/25/2021 12:21 04/25/2021 16:50 04/25/2021 18:30 04/25/2021 22:07 04/26/2021 08:29  Glucose-Capillary Latest Ref Range: 70 - 99 mg/dL 04/28/2021 (H) 235 (H) 573 (H) 222 (H) 123 (H) 291 (H)    Inpatient Diabetes Program Recommendations:   Spoke with patient regarding insulin pump supplies and appointment followup with Dr. 220. Patient has insulin pump supplies with her and appointment to see Dr. Gershon Crane patient states next week but actually November 29th.I spoke with Dr. December 01 office and requested an appointment to review insulin pump settings when patient is discharged from the hospital or to call patient personnally for review. Patient states she feels that her current pump settings will be accurate due to she is now able to eat and her CBGs are rising.  If patient is to restart insulin pump and discharge today: -Hold Levemir (communicated with RN) Will update if callback received from [name] office.  Thank you, Elberta Fortis. Usman Millett, RN, MSN, CDE  Diabetes Coordinator Inpatient Glycemic Control Team Team Pager [phone] (8am-5pm) 04/26/2021 9:12 AM

## 2021-04-26 NOTE — Progress Notes (Signed)
Pt restarting insulin pump per diabetes coordinator recommendation.  CBG 418 due to not receiving morning levemir coverage and no meal coverage today. CNM updated and aware, plans to discharge this afternoon.   Pt to follow up with Dr. Gershon Crane Nov 29 but expecting phone call today to review insulin pump settings during pregnancy.

## 2021-04-26 NOTE — Progress Notes (Signed)
Patient discharged home with family.  Discharge instructions, when to follow up, and prescriptions reviewed with patient.  Patient verbalized understanding. Patient will be escorted out by auxiliary.   

## 2021-04-26 NOTE — Progress Notes (Signed)
CBG 98 per patient glucometer

## 2021-04-26 NOTE — Progress Notes (Signed)
CBG 187 Per patients insulin glucometer

## 2021-04-26 NOTE — Progress Notes (Signed)
Per patient: Lunch- 34 carbs  Glucose- 411 Insulin- 16.29 received

## 2021-04-26 NOTE — Progress Notes (Signed)
Inpatient Diabetes Program Recommendations  AACE/ADA: New Consensus Statement on Inpatient Glycemic Control (2015)  Target Ranges:  Prepandial:   less than 140 mg/dL      Peak postprandial:   less than 180 mg/dL (1-2 hours)      Critically ill patients:  140 - 180 mg/dL   Lab Results  Component Value Date   GLUCAP 342 (H) 04/26/2021   HGBA1C 5.9 (H) 04/22/2021    Review of Glycemic Control  Inpatient Diabetes Program Recommendations:   Spoke with patient and reviewed risk of hypoglycemia with restarting insulin pump @ current settings. Reviewed hypoglycemic protocol with patient with verbalized understanding. Patient is to contact [name] office for insulin pump followup if office does not contact her.  Thank you, Cheryl Avila. Cheryl Erno, RN, MSN, CDE  Diabetes Coordinator Inpatient Glycemic Control Team Team Pager [phone] (8am-5pm) 04/26/2021 2:38 PM

## 2021-04-27 ENCOUNTER — Other Ambulatory Visit: Payer: Self-pay

## 2021-04-28 ENCOUNTER — Ambulatory Visit: Payer: Medicaid Other | Attending: Maternal & Fetal Medicine | Admitting: Maternal & Fetal Medicine

## 2021-04-28 ENCOUNTER — Other Ambulatory Visit: Payer: Self-pay

## 2021-04-28 VITALS — BP 110/78 | HR 102 | Temp 97.9°F | Ht 60.0 in | Wt 216.5 lb

## 2021-04-28 DIAGNOSIS — Z3A09 9 weeks gestation of pregnancy: Secondary | ICD-10-CM | POA: Diagnosis not present

## 2021-04-28 DIAGNOSIS — E109 Type 1 diabetes mellitus without complications: Secondary | ICD-10-CM

## 2021-04-28 DIAGNOSIS — O24019 Pre-existing diabetes mellitus, type 1, in pregnancy, unspecified trimester: Secondary | ICD-10-CM

## 2021-04-28 DIAGNOSIS — O24011 Pre-existing diabetes mellitus, type 1, in pregnancy, first trimester: Secondary | ICD-10-CM | POA: Diagnosis not present

## 2021-04-28 NOTE — Progress Notes (Signed)
MFM Consultation  Cheryl Avila is a 25 yo G3P2 who is here at 9w 6 d with an EDD of 6/16 dated by an early ultrasound. She is seen today at the request of Haroldine Laws, CNM regarding poorly controlled T1DM with recent admission for DKA.   Ms. Bagnato was discharged for diabetic ketoacidosis on 11/15. She presented with new onset intractable N/V.  Since this Ms. Rebeck notes that she is doing better. She reports that she has had wide swings in her blood sugar recently with new lows and high's. Her recent admission resulted in a sudden swing of her blood sugars in the 250's which is unusual for her.   Her DM is managed by her Endocrinologist Dr. Gershon Crane. She has an appointment with him on  11/29. In reviewing her insulin pump she is receiving a basal rate of 3.7 units per hour with a bolus on average of < 1 unit. Her blood sugars as a whole are within range.  Her most recent HgbA1c is 5.9% on 11/11 which is excellent in comparison to her prior early pregnancies.   Her prior pregnancies have had complications. In the G1 the child had an encephalocele and had an emergent LTCSTdue to NR-NST. Her G2 delivered at term but also had a NR-FHT resulting in urgent delivery. Both procedures operative notes document a LTCS not a classical cesarean delivery.  She is not taking low dose ASA. She plans on genetic screening.   Vitals with BMI 04/28/2021 04/26/2021 04/26/2021  Height 5\' 0"  - -  Weight 216 lbs 8 oz - -  BMI 42.28 - -  Systolic 110 129 957  Diastolic 78 86 83  Pulse 102 98 81    CBC Latest Ref Rng & Units 04/26/2021 04/25/2021 04/23/2021  WBC 4.0 - 10.5 K/uL 7.1 7.2 10.9(H)  Hemoglobin 12.0 - 15.0 g/dL 47.3 11.6(L) 12.9  Hematocrit 36.0 - 46.0 % 36.6 33.0(L) 37.0  Platelets 150 - 400 K/uL 222 218 158   CMP Latest Ref Rng & Units 04/26/2021 04/25/2021 04/23/2021  Glucose 70 - 99 mg/dL 403(J) 096(K) 383(K)  BUN 6 - 20 mg/dL 8 7 9   Creatinine 0.44 - 1.00 mg/dL 1.84 0.37 5.43  Sodium 135 - 145  mmol/L 130(L) 134(L) 130(L)  Potassium 3.5 - 5.1 mmol/L 4.2 3.4(L) 3.6  Chloride 98 - 111 mmol/L 102 105 105  CO2 22 - 32 mmol/L 20(L) 21(L) 20(L)  Calcium 8.9 - 10.3 mg/dL 6.0(O) 8.1(L) 7.7(L)  Total Protein 6.5 - 8.1 g/dL - - -  Total Bilirubin 0.3 - 1.2 mg/dL - - -  Alkaline Phos 38 - 126 U/L - - -  AST 15 - 41 U/L - - -  ALT 0 - 44 U/L - - -   History reviewed. No pertinent family history. Past Medical History:  Diagnosis Date   Diabetes mellitus without complication (HCC)     Current Outpatient Medications (Endocrine & Metabolic):    insulin aspart (NOVOLOG) 100 UNIT/ML FlexPen, Inject 0-100 Units into the skin as directed. Via insulin pump    Current Outpatient Medications (Analgesics):    acetaminophen (TYLENOL) 325 MG tablet, Take 2 tablets (650 mg total) by mouth every 4 (four) hours as needed (for pain scale < 4  OR  temperature  >/=  100.5 F). (Patient not taking: Reported on 04/28/2021)  Current Outpatient Medications (Hematological):    folic acid (FOLVITE) 1 MG tablet, Take 1 mg by mouth daily.  Current Outpatient Medications (Other):    metoCLOPramide (  REGLAN) 10 MG tablet, Take 1 tablet (10 mg total) by mouth every 6 (six) hours.   ondansetron (ZOFRAN ODT) 4 MG disintegrating tablet, Take 1 tablet (4 mg total) by mouth every 8 (eight) hours as needed for nausea or vomiting.   Prenatal 28-0.8 MG TABS, Take 1 tablet by mouth daily.   famotidine (PEPCID) 20 MG tablet, Take 1 tablet (20 mg total) by mouth every 12 (twelve) hours. (Patient not taking: Reported on 04/28/2021)  Allergies  Allergen Reactions   Aleve [Naproxen] Hives   Imaging: None today  Impression/Counseling:  I discussed Ms. Langhans's history with her which she confirmed.   We discussed the prior history of an encephalocele in her prior pregnancy. I discussed that the ONTD was likely associated with poorly controlled blood sugar as she had an early HgbA1c of 9. I explained that elevated blood  sugars is associated with ONTD. She is taking 1 mcg of folic acid at this time which is good but need to be 4 mg in all future pregnancies. She is beyond the the first 28 days at the point and she can continue with her current dose.  Secondly, I encouraged her to continue her efforts with her blood sugars as she has really good control. She has a CGM and an insulin pump that appears to be well titrated. She is very insulin sensitive and may have occasional swings. She will discuss further with Dr. Gershon Crane on 11/29.  Thirdly, I recommend she start daily low dose ASA for risk for PTD and preeclampsia. She should have an EKG, Eye exam and UPC if not performed.   Fourthly, I discussed the increased risk for fetal cardiac anomalies in women with pregestational diabetes, a fetal echo should be performed between 22-26 weeks.   Lastly, I discussed the role for a 11-12 weeks first trimester anatomy exam, a detailed exam at 18-20 weeks, fetal echo between 22--26  weeks, with further growth at 28/32/36 weeks. Initiate weekly testing at 32 weeks.   Delivery can be performed between 37-38 weeks pending Ms. Charette's blood sugar and fetal status. Prior pregnancies presented with NR-FHT aroudn 34-36 weeks so we should be deligent with fetal surveillance at that time.   Ms. Krupinski had no further questions.   I spent 60 minutes with > 50% in face to face consultation.  Novella Olive, MD.

## 2021-05-02 ENCOUNTER — Other Ambulatory Visit: Payer: Self-pay

## 2021-05-02 DIAGNOSIS — O210 Mild hyperemesis gravidarum: Secondary | ICD-10-CM

## 2021-05-03 ENCOUNTER — Ambulatory Visit
Admission: RE | Admit: 2021-05-03 | Discharge: 2021-05-03 | Disposition: A | Discharge status: Home / Self Care | Payer: Medicaid Other | Source: Ambulatory Visit

## 2021-05-03 DIAGNOSIS — O210 Mild hyperemesis gravidarum: Secondary | ICD-10-CM | POA: Diagnosis not present

## 2021-05-03 DIAGNOSIS — Z3A00 Weeks of gestation of pregnancy not specified: Secondary | ICD-10-CM | POA: Diagnosis not present

## 2021-05-03 LAB — GLUCOSE, CAPILLARY: Glucose-Capillary: 66 mg/dL — ABNORMAL LOW (ref 70–99)

## 2021-05-03 MED ORDER — SODIUM CHLORIDE FLUSH 0.9 % IV SOLN
INTRAVENOUS | Status: AC
  Filled 2021-05-03: qty 20

## 2021-05-03 MED ORDER — SODIUM CHLORIDE FLUSH 0.9 % IV SOLN
INTRAVENOUS | Status: AC
  Filled 2021-05-03: qty 10

## 2021-05-03 MED ORDER — THIAMINE HCL 100 MG/ML IJ SOLN
INTRAVENOUS | Status: DC
  Filled 2021-05-03 (×14): qty 1000

## 2021-05-03 MED ORDER — ONDANSETRON HCL 4 MG/2ML IJ SOLN
4.0000 mg | Freq: Once | INTRAMUSCULAR | Status: AC
  Administered 2021-05-03: 4 mg via INTRAVENOUS

## 2021-05-03 MED ORDER — ONDANSETRON HCL 4 MG/2ML IJ SOLN
INTRAMUSCULAR | Status: AC
  Filled 2021-05-03: qty 2

## 2021-05-03 MED ORDER — SODIUM CHLORIDE 0.9 % IV BOLUS
1000.0000 mL | Freq: Once | INTRAVENOUS | Status: AC
  Administered 2021-05-03: 1000 mL via INTRAVENOUS

## 2021-05-03 MED ORDER — THIAMINE HCL 100 MG/ML IJ SOLN
INTRAVENOUS | Status: AC
  Filled 2021-05-03: qty 1000

## 2021-05-08 ENCOUNTER — Emergency Department
Admission: EM | Admit: 2021-05-08 | Discharge: 2021-05-08 | Disposition: A | Discharge status: Home / Self Care | Payer: Medicaid Other | Attending: Emergency Medicine | Admitting: Emergency Medicine

## 2021-05-08 ENCOUNTER — Other Ambulatory Visit: Payer: Self-pay

## 2021-05-08 DIAGNOSIS — Z5321 Procedure and treatment not carried out due to patient leaving prior to being seen by health care provider: Secondary | ICD-10-CM | POA: Diagnosis not present

## 2021-05-08 DIAGNOSIS — R112 Nausea with vomiting, unspecified: Secondary | ICD-10-CM | POA: Insufficient documentation

## 2021-05-08 DIAGNOSIS — E119 Type 2 diabetes mellitus without complications: Secondary | ICD-10-CM | POA: Insufficient documentation

## 2021-05-08 LAB — CBC
HCT: 41.7 % (ref 36.0–46.0)
Hemoglobin: 14.7 g/dL (ref 12.0–15.0)
MCH: 31.7 pg (ref 26.0–34.0)
MCHC: 35.3 g/dL (ref 30.0–36.0)
MCV: 89.9 fL (ref 80.0–100.0)
Platelets: 340 10*3/uL (ref 150–400)
RBC: 4.64 MIL/uL (ref 3.87–5.11)
RDW: 13.2 % (ref 11.5–15.5)
WBC: 8.9 10*3/uL (ref 4.0–10.5)
nRBC: 0 % (ref 0.0–0.2)

## 2021-05-08 LAB — BASIC METABOLIC PANEL WITH GFR
Anion gap: 5 (ref 5–15)
BUN: 10 mg/dL (ref 6–20)
CO2: 24 mmol/L (ref 22–32)
Calcium: 9 mg/dL (ref 8.9–10.3)
Chloride: 105 mmol/L (ref 98–111)
Creatinine, Ser: 0.7 mg/dL (ref 0.44–1.00)
GFR, Estimated: >60 mL/min
Glucose, Bld: 108 mg/dL — ABNORMAL HIGH (ref 70–99)
Potassium: 4.1 mmol/L (ref 3.5–5.1)
Sodium: 134 mmol/L — ABNORMAL LOW (ref 135–145)

## 2021-05-08 LAB — CBG MONITORING, ED: Glucose-Capillary: 93 mg/dL (ref 70–99)

## 2021-05-08 LAB — BETA-HYDROXYBUTYRIC ACID: Beta-Hydroxybutyric Acid: 0.67 mmol/L — ABNORMAL HIGH (ref 0.05–0.27)

## 2021-05-08 MED ORDER — ONDANSETRON 4 MG PO TBDP
4.0000 mg | ORAL_TABLET | Freq: Once | ORAL | Status: AC
  Administered 2021-05-08: 17:00:00 4 mg via ORAL

## 2021-05-08 MED ORDER — ONDANSETRON 4 MG PO TBDP
ORAL_TABLET | ORAL | Status: AC
  Filled 2021-05-08: qty 1

## 2021-05-08 NOTE — ED Triage Notes (Signed)
Pt presents via POV c/o N/V x2 days. Reports 12wks OB. Reports had similar symptoms with dx DKA last episode. Hx DMI. Has insulin pump per pt reports. Reports current glucose 104 per insulin pump. Ambulatory to triage.

## 2021-05-09 ENCOUNTER — Ambulatory Visit
Admission: RE | Admit: 2021-05-09 | Discharge: 2021-05-09 | Disposition: A | Discharge status: Home / Self Care | Payer: Medicaid Other | Source: Ambulatory Visit | Attending: Certified Nurse Midwife | Admitting: Certified Nurse Midwife

## 2021-05-09 ENCOUNTER — Other Ambulatory Visit: Payer: Self-pay | Admitting: Obstetrics and Gynecology

## 2021-05-09 DIAGNOSIS — O210 Mild hyperemesis gravidarum: Secondary | ICD-10-CM | POA: Insufficient documentation

## 2021-05-09 DIAGNOSIS — Z3A12 12 weeks gestation of pregnancy: Secondary | ICD-10-CM | POA: Insufficient documentation

## 2021-05-09 LAB — GLUCOSE, CAPILLARY
Glucose-Capillary: 51 mg/dL — ABNORMAL LOW (ref 70–99)
Glucose-Capillary: 144 mg/dL — ABNORMAL HIGH (ref 70–99)

## 2021-05-09 MED ORDER — THIAMINE HCL 100 MG/ML IJ SOLN
Freq: Once | INTRAVENOUS | Status: AC
  Filled 2021-05-09: qty 1000

## 2021-05-09 MED ORDER — ONDANSETRON HCL 4 MG/2ML IJ SOLN
4.0000 mg | Freq: Once | INTRAMUSCULAR | Status: AC
  Administered 2021-05-09: 10:00:00 4 mg via INTRAVENOUS

## 2021-05-09 MED ORDER — ONDANSETRON HCL 4 MG/2ML IJ SOLN
INTRAMUSCULAR | Status: AC
  Filled 2021-05-09: qty 2

## 2021-05-09 NOTE — Progress Notes (Signed)
Pt. requested orange juice and peanut butter crackers because she felt her sugar dropping. Pt's glucose monitor was reading 70. Manual FSBS was 51. Pt. requested frosted flakes cereal and milk; ordered from cafeteria. Notified [employer], CNW. No new orders were received but advised pt. to go to ER if she was not feeling any better. Pt. drank more orange juice and ate cereal. Pt's FSBS rechecked and was 144. Pt. stated she felt better and left for home.

## 2021-05-13 ENCOUNTER — Inpatient Hospital Stay
Admission: EM | Admit: 2021-05-13 | Discharge: 2021-05-15 | DRG: 832 | Disposition: A | Discharge status: Home / Self Care | Payer: Medicaid Other | Attending: Obstetrics and Gynecology | Admitting: Obstetrics and Gynecology

## 2021-05-13 ENCOUNTER — Other Ambulatory Visit: Payer: Self-pay | Admitting: Certified Nurse Midwife

## 2021-05-13 ENCOUNTER — Inpatient Hospital Stay: Admit: 2021-05-13 | Payer: Medicaid Other | Source: Ambulatory Visit

## 2021-05-13 ENCOUNTER — Inpatient Hospital Stay: Admit: 2021-05-13 | Payer: Medicaid Other

## 2021-05-13 ENCOUNTER — Other Ambulatory Visit: Payer: Self-pay

## 2021-05-13 DIAGNOSIS — O09211 Supervision of pregnancy with history of pre-term labor, first trimester: Secondary | ICD-10-CM

## 2021-05-13 DIAGNOSIS — O24011 Pre-existing diabetes mellitus, type 1, in pregnancy, first trimester: Secondary | ICD-10-CM | POA: Diagnosis present

## 2021-05-13 DIAGNOSIS — E10649 Type 1 diabetes mellitus with hypoglycemia without coma: Secondary | ICD-10-CM | POA: Diagnosis present

## 2021-05-13 DIAGNOSIS — Z794 Long term (current) use of insulin: Secondary | ICD-10-CM

## 2021-05-13 DIAGNOSIS — O210 Mild hyperemesis gravidarum: Secondary | ICD-10-CM | POA: Diagnosis present

## 2021-05-13 DIAGNOSIS — O0991 Supervision of high risk pregnancy, unspecified, first trimester: Secondary | ICD-10-CM

## 2021-05-13 DIAGNOSIS — Z9641 Presence of insulin pump (external) (internal): Secondary | ICD-10-CM | POA: Diagnosis present

## 2021-05-13 DIAGNOSIS — Z20822 Contact with and (suspected) exposure to covid-19: Secondary | ICD-10-CM | POA: Diagnosis present

## 2021-05-13 DIAGNOSIS — O99211 Obesity complicating pregnancy, first trimester: Secondary | ICD-10-CM | POA: Diagnosis present

## 2021-05-13 DIAGNOSIS — O211 Hyperemesis gravidarum with metabolic disturbance: Principal | ICD-10-CM | POA: Diagnosis present

## 2021-05-13 DIAGNOSIS — O34219 Maternal care for unspecified type scar from previous cesarean delivery: Secondary | ICD-10-CM | POA: Diagnosis present

## 2021-05-13 DIAGNOSIS — Z3A12 12 weeks gestation of pregnancy: Secondary | ICD-10-CM | POA: Diagnosis not present

## 2021-05-13 DIAGNOSIS — E1065 Type 1 diabetes mellitus with hyperglycemia: Secondary | ICD-10-CM | POA: Diagnosis present

## 2021-05-13 LAB — GLUCOSE, CAPILLARY
Glucose-Capillary: 148 mg/dL — ABNORMAL HIGH (ref 70–99)
Glucose-Capillary: 65 mg/dL — ABNORMAL LOW (ref 70–99)
Glucose-Capillary: 49 mg/dL — ABNORMAL LOW (ref 70–99)
Glucose-Capillary: 213 mg/dL — ABNORMAL HIGH (ref 70–99)
Glucose-Capillary: 236 mg/dL — ABNORMAL HIGH (ref 70–99)
Glucose-Capillary: 192 mg/dL — ABNORMAL HIGH (ref 70–99)
Glucose-Capillary: 216 mg/dL — ABNORMAL HIGH (ref 70–99)

## 2021-05-13 LAB — COMPREHENSIVE METABOLIC PANEL
ALT: 16 U/L (ref 0–44)
AST: 13 U/L — ABNORMAL LOW (ref 15–41)
Albumin: 3.8 g/dL (ref 3.5–5.0)
Alkaline Phosphatase: 64 U/L (ref 38–126)
Anion gap: 6 (ref 5–15)
BUN: 9 mg/dL (ref 6–20)
CO2: 24 mmol/L (ref 22–32)
Calcium: 9 mg/dL (ref 8.9–10.3)
Chloride: 103 mmol/L (ref 98–111)
Creatinine, Ser: 0.57 mg/dL (ref 0.44–1.00)
GFR, Estimated: >60 mL/min (ref >60)
Glucose, Bld: 146 mg/dL — ABNORMAL HIGH (ref 70–99)
Potassium: 3.8 mmol/L (ref 3.5–5.1)
Sodium: 133 mmol/L — ABNORMAL LOW (ref 135–145)
Total Bilirubin: 0.7 mg/dL (ref 0.3–1.2)
Total Protein: 7.3 g/dL (ref 6.5–8.1)

## 2021-05-13 LAB — MAGNESIUM: Magnesium: 2 mg/dL (ref 1.7–2.4)

## 2021-05-13 LAB — CBC
HCT: 38.7 % (ref 36.0–46.0)
Hemoglobin: 13.8 g/dL (ref 12.0–15.0)
MCH: 31.3 pg (ref 26.0–34.0)
MCHC: 35.7 g/dL (ref 30.0–36.0)
MCV: 87.8 fL (ref 80.0–100.0)
Platelets: 337 10*3/uL (ref 150–400)
RBC: 4.41 MIL/uL (ref 3.87–5.11)
RDW: 12.9 % (ref 11.5–15.5)
WBC: 10.8 10*3/uL — ABNORMAL HIGH (ref 4.0–10.5)
nRBC: 0 % (ref 0.0–0.2)

## 2021-05-13 LAB — RESP PANEL BY RT-PCR (FLU A&B, COVID) ARPGX2
Influenza A by PCR: NEGATIVE
Influenza B by PCR: NEGATIVE
SARS Coronavirus 2 by RT PCR: NEGATIVE

## 2021-05-13 LAB — PHOSPHORUS: Phosphorus: 4.5 mg/dL (ref 2.5–4.6)

## 2021-05-13 LAB — TSH: TSH: 2.642 u[IU]/mL (ref 0.350–4.500)

## 2021-05-13 MED ORDER — GLUCAGON HCL RDNA (DIAGNOSTIC) 1 MG IJ SOLR: 1.0000 mg | INTRAMUSCULAR | Status: AC

## 2021-05-13 MED ORDER — GLUCOSE 40 % PO GEL
1.0000 | ORAL | Status: AC
  Administered 2021-05-13: 31 g via ORAL
  Filled 2021-05-13: qty 1

## 2021-05-13 MED ORDER — FAMOTIDINE 20 MG PO TABS
20.0000 mg | ORAL_TABLET | Freq: Two times a day (BID) | ORAL | Status: DC
  Administered 2021-05-15: 10:00:00 20 mg via ORAL
  Filled 2021-05-13: qty 1

## 2021-05-13 MED ORDER — PROMETHAZINE HCL 25 MG RE SUPP: 12.5000 mg | RECTAL | Status: DC | PRN

## 2021-05-13 MED ORDER — DEXTROSE IN LACTATED RINGERS 5 % IV SOLN: INTRAVENOUS | Status: DC

## 2021-05-13 MED ORDER — HYDROXYZINE HCL 50 MG PO TABS
50.0000 mg | ORAL_TABLET | Freq: Four times a day (QID) | ORAL | Status: DC | PRN
  Filled 2021-05-13: qty 1

## 2021-05-13 MED ORDER — INSULIN PUMP
SUBCUTANEOUS | Status: DC
  Filled 2021-05-13: qty 1

## 2021-05-13 MED ORDER — ONDANSETRON HCL 4 MG/2ML IJ SOLN
4.0000 mg | Freq: Three times a day (TID) | INTRAMUSCULAR | Status: DC | PRN
  Administered 2021-05-13: 4 mg via INTRAVENOUS
  Filled 2021-05-13: qty 2

## 2021-05-13 MED ORDER — SODIUM CHLORIDE 0.9 % IV SOLN
12.5000 mg | Freq: Four times a day (QID) | INTRAVENOUS | Status: DC | PRN
  Filled 2021-05-13: qty 0.5

## 2021-05-13 MED ORDER — FAMOTIDINE IN NACL 20-0.9 MG/50ML-% IV SOLN
20.0000 mg | Freq: Two times a day (BID) | INTRAVENOUS | Status: DC
  Administered 2021-05-13 – 2021-05-14 (×3): 20 mg via INTRAVENOUS
  Filled 2021-05-13 (×3): qty 50

## 2021-05-13 MED ORDER — PROMETHAZINE HCL 25 MG PO TABS
12.5000 mg | ORAL_TABLET | ORAL | Status: DC | PRN
  Administered 2021-05-15: 18:00:00 25 mg via ORAL
  Filled 2021-05-13 (×2): qty 1

## 2021-05-13 MED ORDER — THIAMINE HCL 100 MG/ML IJ SOLN
Freq: Once | INTRAVENOUS | Status: AC
  Filled 2021-05-13: qty 1000

## 2021-05-13 MED ORDER — DEXTROSE 50 % IV SOLN: 0.0000 mL | INTRAVENOUS | Status: DC | PRN

## 2021-05-13 MED ORDER — DEXTROSE 50 % IV SOLN
INTRAVENOUS | Status: AC
  Filled 2021-05-13: qty 50

## 2021-05-13 MED ORDER — LACTATED RINGERS IV SOLN: INTRAVENOUS | Status: DC

## 2021-05-13 MED ORDER — METOCLOPRAMIDE HCL 10 MG PO TABS
10.0000 mg | ORAL_TABLET | Freq: Four times a day (QID) | ORAL | Status: DC
  Administered 2021-05-15 (×2): 10 mg via ORAL
  Filled 2021-05-13 (×4): qty 1

## 2021-05-13 MED ORDER — METOCLOPRAMIDE HCL 5 MG/ML IJ SOLN
10.0000 mg | Freq: Four times a day (QID) | INTRAMUSCULAR | Status: DC
  Administered 2021-05-13 – 2021-05-15 (×6): 10 mg via INTRAVENOUS
  Filled 2021-05-13 (×8): qty 2

## 2021-05-13 MED ORDER — DEXTROSE 50 % IV SOLN: 12.5000 g | INTRAVENOUS | Status: AC

## 2021-05-13 MED ORDER — ONDANSETRON 4 MG PO TBDP: 4.0000 mg | ORAL_TABLET | Freq: Three times a day (TID) | ORAL | Status: DC | PRN

## 2021-05-13 MED ORDER — INSULIN REGULAR(HUMAN) IN NACL 100-0.9 UT/100ML-% IV SOLN
INTRAVENOUS | Status: DC
  Administered 2021-05-13: 7 [IU]/h via INTRAVENOUS
  Filled 2021-05-13 (×2): qty 100

## 2021-05-13 MED ORDER — SODIUM CHLORIDE 0.9 % IV SOLN
8.0000 mg | Freq: Three times a day (TID) | INTRAVENOUS | Status: DC | PRN
  Administered 2021-05-14: 8 mg via INTRAVENOUS
  Filled 2021-05-13: qty 4

## 2021-05-13 MED ORDER — DEXTROSE 50 % IV SOLN
25.0000 g | INTRAVENOUS | Status: AC
  Administered 2021-05-13: 25 g via INTRAVENOUS

## 2021-05-13 MED ORDER — HYDROXYZINE HCL 50 MG/ML IM SOLN
50.0000 mg | Freq: Four times a day (QID) | INTRAMUSCULAR | Status: DC | PRN
  Filled 2021-05-13: qty 1

## 2021-05-13 NOTE — Progress Notes (Signed)
Direct-admit to M/B for hyperemesis and T1DM. Orders placed.

## 2021-05-13 NOTE — H&P (Signed)
HISTORY AND PHYSICAL NOTE  History of Present Illness: Cheryl Avila is a 25 y.o. [id number] at [redacted]w[redacted]d admitted for intractable nausea and vomiting in pregnancy.  She presented to mother/baby with complaints of nausea, vomiting, fatigue, and hypoglycemia.  She has been unable to successfully keep food or liquids down for 2 days.  She has tried oral hydration to help with N/V but has not been effective.  She denies cramping, vaginal bleeding, or LOF.  Has not yet been able to feel fetal movement yet.    Factors complicating pregnancy:  1. 1. Severe Nausea and Vomiting 2. Type 1 Diabetes Mellitus in Pregnancy           Endocrinologist: Dr. Gershon Crane - last appt 02/28/21 (was supposed to have appointment 05/11/21 but cancelled appointment and did not reschedule) 3. Previous C/S x2 - G1 Classical, G2 LTCS 4. BMI 40 or more - Obesity Class III  5. H/o Preterm delivery          G1 at [redacted]w[redacted]d and G2 at [redacted]w[redacted]d  Prenatal care site:  [facility]  Patient Active Problem List   Diagnosis Date Noted   Hyperemesis 04/23/2021   Hyperemesis gravidarum 04/23/2021   Type 1 diabetes mellitus with hyperglycemia (HCC) 04/22/2021   Supervision of high risk pregnancy in first trimester 04/12/2021   [redacted] weeks gestation of pregnancy    Abnormal findings on antenatal screening    Anxiety 10/18/2015    Past Medical History:  Diagnosis Date   Diabetes mellitus without complication (HCC)     Past Surgical History:  Procedure Laterality Date   CESAREAN SECTION     NO PAST SURGERIES      OB History  Gravida Para Term Preterm AB Living  3 2   2   2   SAB IAB Ectopic Multiple Live Births          2    # Outcome Date GA Lbr Len/2nd Weight Sex Delivery Anes PTL Lv  3 Current           2 Preterm 07/07/18 [redacted]w[redacted]d  4295 g F CS-LTranv   LIV  1 Preterm 07/26/16 [redacted]w[redacted]d   F CS-LTranv   LIV    Social History:  reports that she has never smoked. She has never used smokeless tobacco. She reports that she  does not drink alcohol and does not use drugs.  Family History: family history is not on file.  Allergies  Allergen Reactions   Aleve [Naproxen] Hives    Medications Prior to Admission  Medication Sig Dispense Refill Last Dose   acetaminophen (TYLENOL) 325 MG tablet Take 2 tablets (650 mg total) by mouth every 4 (four) hours as needed (for pain scale < 4  OR  temperature  >/=  100.5 F). (Patient not taking: Reported on 04/28/2021)      famotidine (PEPCID) 20 MG tablet Take 1 tablet (20 mg total) by mouth every 12 (twelve) hours. (Patient not taking: Reported on 04/28/2021)      folic acid (FOLVITE) 1 MG tablet Take 1 mg by mouth daily.      insulin aspart (NOVOLOG) 100 UNIT/ML FlexPen Inject 0-100 Units into the skin as directed. Via insulin pump      metoCLOPramide (REGLAN) 10 MG tablet Take 1 tablet (10 mg total) by mouth every 6 (six) hours. 30 tablet 3    ondansetron (ZOFRAN ODT) 4 MG disintegrating tablet Take 1 tablet (4 mg total) by mouth every 8 (eight) hours as needed for nausea  or vomiting. 20 tablet 0    Prenatal 28-0.8 MG TABS Take 1 tablet by mouth daily.       Review of Systems  Constitutional:  Positive for malaise/fatigue. Negative for chills and fever.  Eyes:  Negative for blurred vision.  Respiratory:  Negative for cough and shortness of breath.   Cardiovascular:  Negative for chest pain and palpitations.  Gastrointestinal:  Positive for nausea and vomiting.  Neurological:  Negative for dizziness and loss of consciousness.   Physical Examination: Vitals:  BP 112/75   Pulse 81   Temp 97.9 F (36.6 C) (Oral)   Resp 18   LMP 01/27/2021 (Approximate)   SpO2 100%  General: no acute distress.  HEENT: normocephalic, atraumatic Heart: regular rate & rhythm.  No murmurs/rubs/gallops Lungs: clear to auscultation bilaterally, normal respiratory effort Abdomen: soft, gravid, c/w with [redacted] week gestation, non-tender  Pelvic: deferred  Extremities: non-tender, symmetric,  no edema bilaterally.  DTRs: +2  Neurologic: Alert & oriented x 3.    Bedside US: approximately 12wk fetus with + cardiac motion and active fetal movement  Labs:  Results for orders placed or performed during the hospital encounter of 05/13/21 (from the past 24 hour(s))  Glucose, capillary   Collection Time: 05/13/21  6:39 PM  Result Value Ref Range   Glucose-Capillary 148 (H) 70 - 99 mg/dL  CBC   Collection Time: 05/13/21  6:46 PM  Result Value Ref Range   WBC 10.8 (H) 4.0 - 10.5 K/uL   RBC 4.41 3.87 - 5.11 MIL/uL   Hemoglobin 13.8 12.0 - 15.0 g/dL   HCT 36.6 44.0 - 34.7 %   MCV 87.8 80.0 - 100.0 fL   MCH 31.3 26.0 - 34.0 pg   MCHC 35.7 30.0 - 36.0 g/dL   RDW 42.5 95.6 - 38.7 %   Platelets 337 150 - 400 K/uL   nRBC 0.0 0.0 - 0.2 %  Comprehensive metabolic panel   Collection Time: 05/13/21  6:46 PM  Result Value Ref Range   Sodium 133 (L) 135 - 145 mmol/L   Potassium 3.8 3.5 - 5.1 mmol/L   Chloride 103 98 - 111 mmol/L   CO2 24 22 - 32 mmol/L   Glucose, Bld 146 (H) 70 - 99 mg/dL   BUN 9 6 - 20 mg/dL   Creatinine, Ser 5.64 0.44 - 1.00 mg/dL   Calcium 9.0 8.9 - 33.2 mg/dL   Total Protein 7.3 6.5 - 8.1 g/dL   Albumin 3.8 3.5 - 5.0 g/dL   AST 13 (L) 15 - 41 U/L   ALT 16 0 - 44 U/L   Alkaline Phosphatase 64 38 - 126 U/L   Total Bilirubin 0.7 0.3 - 1.2 mg/dL   GFR, Estimated >95 >18 mL/min   Anion gap 6 5 - 15  TSH   Collection Time: 05/13/21  6:46 PM  Result Value Ref Range   TSH 2.642 0.350 - 4.500 uIU/mL  Magnesium   Collection Time: 05/13/21  6:46 PM  Result Value Ref Range   Magnesium 2.0 1.7 - 2.4 mg/dL  Phosphorus   Collection Time: 05/13/21  6:46 PM  Result Value Ref Range   Phosphorus 4.5 2.5 - 4.6 mg/dL  Resp Panel by RT-PCR (Flu A&B, Covid) Nasopharyngeal Swab   Collection Time: 05/13/21  7:00 PM   Specimen: Nasopharyngeal Swab; Nasopharyngeal(NP) swabs in vial transport medium  Result Value Ref Range   SARS Coronavirus 2 by RT PCR NEGATIVE NEGATIVE    Influenza A by PCR  NEGATIVE NEGATIVE   Influenza B by PCR NEGATIVE NEGATIVE  Glucose, capillary   Collection Time: 05/13/21  8:02 PM  Result Value Ref Range   Glucose-Capillary 65 (L) 70 - 99 mg/dL    Imaging Studies: US OB LESS THAN 14 WEEKS WITH OB TRANSVAGINAL  Result Date: 04/24/2021 CLINICAL DATA:  Gestational diabetes, pregnant, LMP 01/27/2021 EXAM: OBSTETRIC <14 WK [place] AND TRANSVAGINAL OB US TECHNIQUE: Both transabdominal and transvaginal ultrasound examinations were performed for complete evaluation of the gestation as well as the maternal uterus, adnexal regions, and pelvic cul-de-sac. Transvaginal technique was performed to assess early pregnancy. COMPARISON:  None. FINDINGS: Intrauterine gestational sac: Single Yolk sac:  Visualized. Embryo:  Visualized. Cardiac Activity: Choose Heart Rate: 178 bpm MSD: Appropriate given fetal size CRL:  24 mm   9 w   1 d                  [place] EDC: 11/26/2020 Subchorionic hemorrhage:  None visualized. Maternal uterus/adnexae: The uterus is anteverted. The cervix is unremarkable. No intrauterine masses are seen. There is mild simple appearing free fluid within the pelvis. The maternal ovaries are unremarkable. No adnexal masses are seen. IMPRESSION: Single living intrauterine gestation with an estimated gestational age of [redacted] weeks, 1 day. No acute abnormality. Electronically Signed   By: Helyn Numbers M.D.   On: 04/24/2021 20:06    Assessment and Plan: Patient Active Problem List   Diagnosis Date Noted   Hyperemesis 04/23/2021   Hyperemesis gravidarum 04/23/2021   Type 1 diabetes mellitus with hyperglycemia (HCC) 04/22/2021   Supervision of high risk pregnancy in first trimester 04/12/2021   [redacted] weeks gestation of pregnancy    Abnormal findings on antenatal screening    Anxiety 10/18/2015    1. Admit to Antenatal - Covid admission screen  - hyperemesis protocol initiated  - NPO - advance diet as tolerated  - Maintain IV fluids and IV electrolyte  replacement  - Scheduled IV/PO antiemetics ordered with PRN IV/PO/PR antiemetics for breakthrough N/V  - Daily weight  - Q4 hour intake/output   2. Routine antenatal care  - Activity as tolerated  - Bathroom privileges  - FHT q day   3. T1DM - She is experiencing hypoglycemic episodes. Her insulin pump is on and working. Hypogylcemia protocol ordered as needed. - Discussed with Dr. Valentino Saxon and per Dr. Valentino Saxon, turn insulin pump off and initiate Endotool. If blood sugars are labile, consult hospitalist.  Janyce Llanos, CNM  Certified Nurse Midwife [facility]  Cl[facility] OB/[facility]

## 2021-05-13 NOTE — ED Triage Notes (Signed)
Pt is [redacted] weeks pregnant , states she has been having issues with hyperemesis throughout her pregnant, denies any pain or vaginal bleeding. Pt is in NAD on arrival with a steady gait

## 2021-05-13 NOTE — ED Notes (Signed)
Spoke with mother/baby [phone] states pt is a direct admission and is suppose to go to rm [id number]

## 2021-05-14 DIAGNOSIS — O210 Mild hyperemesis gravidarum: Secondary | ICD-10-CM | POA: Diagnosis present

## 2021-05-14 LAB — GLUCOSE, CAPILLARY
Glucose-Capillary: 100 mg/dL — ABNORMAL HIGH (ref 70–99)
Glucose-Capillary: 103 mg/dL — ABNORMAL HIGH (ref 70–99)
Glucose-Capillary: 112 mg/dL — ABNORMAL HIGH (ref 70–99)
Glucose-Capillary: 119 mg/dL — ABNORMAL HIGH (ref 70–99)
Glucose-Capillary: 120 mg/dL — ABNORMAL HIGH (ref 70–99)
Glucose-Capillary: 123 mg/dL — ABNORMAL HIGH (ref 70–99)
Glucose-Capillary: 124 mg/dL — ABNORMAL HIGH (ref 70–99)
Glucose-Capillary: 127 mg/dL — ABNORMAL HIGH (ref 70–99)
Glucose-Capillary: 130 mg/dL — ABNORMAL HIGH (ref 70–99)
Glucose-Capillary: 130 mg/dL — ABNORMAL HIGH (ref 70–99)
Glucose-Capillary: 135 mg/dL — ABNORMAL HIGH (ref 70–99)
Glucose-Capillary: 145 mg/dL — ABNORMAL HIGH (ref 70–99)
Glucose-Capillary: 151 mg/dL — ABNORMAL HIGH (ref 70–99)
Glucose-Capillary: 177 mg/dL — ABNORMAL HIGH (ref 70–99)
Glucose-Capillary: 83 mg/dL (ref 70–99)
Glucose-Capillary: 86 mg/dL (ref 70–99)
Glucose-Capillary: 90 mg/dL (ref 70–99)

## 2021-05-14 LAB — URINALYSIS, ROUTINE W REFLEX MICROSCOPIC
Bilirubin Urine: NEGATIVE
Bilirubin Urine: NEGATIVE
Glucose, UA: >=500 mg/dL — AB
Glucose, UA: NEGATIVE mg/dL
Hgb urine dipstick: NEGATIVE
Hgb urine dipstick: NEGATIVE
Ketones, ur: 20 mg/dL — AB
Ketones, ur: NEGATIVE mg/dL
Leukocytes,Ua: NEGATIVE
Leukocytes,Ua: NEGATIVE
Nitrite: NEGATIVE
Nitrite: NEGATIVE
Protein, ur: NEGATIVE mg/dL
Protein, ur: NEGATIVE mg/dL
Specific Gravity, Urine: 1.01 (ref 1.005–1.030)
Specific Gravity, Urine: 1.013 (ref 1.005–1.030)
pH: 6 (ref 5.0–8.0)
pH: 8 (ref 5.0–8.0)

## 2021-05-14 LAB — BASIC METABOLIC PANEL
Anion gap: 7 (ref 5–15)
BUN: 7 mg/dL (ref 6–20)
CO2: 22 mmol/L (ref 22–32)
Calcium: 8.2 mg/dL — ABNORMAL LOW (ref 8.9–10.3)
Chloride: 105 mmol/L (ref 98–111)
Creatinine, Ser: 0.5 mg/dL (ref 0.44–1.00)
GFR, Estimated: >60 mL/min (ref >60)
Glucose, Bld: 121 mg/dL — ABNORMAL HIGH (ref 70–99)
Potassium: 3.6 mmol/L (ref 3.5–5.1)
Sodium: 134 mmol/L — ABNORMAL LOW (ref 135–145)

## 2021-05-14 MED ORDER — INSULIN ASPART 100 UNIT/ML IJ SOLN
0.0000 [IU] | INTRAMUSCULAR | Status: DC
  Administered 2021-05-14: 2 [IU] via SUBCUTANEOUS
  Administered 2021-05-14: 3 [IU] via SUBCUTANEOUS
  Administered 2021-05-14: 2 [IU] via SUBCUTANEOUS
  Administered 2021-05-15: 12:00:00 4 [IU] via SUBCUTANEOUS
  Administered 2021-05-15: 08:00:00 1 [IU] via SUBCUTANEOUS
  Administered 2021-05-15: 04:00:00 2 [IU] via SUBCUTANEOUS
  Filled 2021-05-14 (×6): qty 1

## 2021-05-14 MED ORDER — INSULIN DETEMIR 100 UNIT/ML ~~LOC~~ SOLN
12.0000 [IU] | Freq: Two times a day (BID) | SUBCUTANEOUS | Status: DC
  Administered 2021-05-14 – 2021-05-15 (×2): 12 [IU] via SUBCUTANEOUS
  Filled 2021-05-14 (×3): qty 0.12

## 2021-05-14 MED ORDER — ONDANSETRON HCL 4 MG/2ML IJ SOLN
4.0000 mg | Freq: Four times a day (QID) | INTRAMUSCULAR | Status: DC
  Administered 2021-05-14 – 2021-05-15 (×4): 4 mg via INTRAVENOUS
  Filled 2021-05-14 (×4): qty 2

## 2021-05-14 MED ORDER — ONDANSETRON HCL 4 MG PO TABS
4.0000 mg | ORAL_TABLET | Freq: Four times a day (QID) | ORAL | Status: DC
  Administered 2021-05-15: 15:00:00 4 mg via ORAL
  Filled 2021-05-14 (×2): qty 1

## 2021-05-14 NOTE — Progress Notes (Signed)
ANTEPARTUM PROGRESS NOTE  Cheryl Avila is a 25 y.o. [id number] at [redacted]w[redacted]d with [facility] of Estimated Date of Delivery: 11/25/21 who is admitted for Hyperemesis gravidarum and type 1 DM.   Length of Stay:  1 Days. Admitted 05/13/2021  Subjective: Continues to feel nausea, but has not vomited since about 1800 last night. Feels slightly better overall and would like to trial PO clear fluids.  She reports no uterine contractions, no bleeding and no loss of fluid per vagina.  Vitals:  BP (!) 97/55 (BP Location: Right Arm)   Pulse 85   Temp 98.4 F (36.9 C) (Oral)   Resp 16   Ht 5' (1.524 m)   Wt 98 kg   LMP 01/27/2021 (Approximate)   SpO2 100%   BMI 42.19 kg/m   Physical Examination: CONSTITUTIONAL: Well-developed, well-nourished female in no acute distress.  HENT:  Normocephalic, atraumatic NECK: Normal range of motion SKIN: Skin is warm and dry. NEUROLGIC: Alert and oriented to person, place, and time.  PSYCHIATRIC: Normal mood and affect. Normal behavior. Normal judgment and thought content. MUSCULOSKELETAL: Normal range of motion. No edema and no tenderness. 2+ distal pulses. ABDOMEN: Soft, nontender, nondistended, gravid. CERVIX: deferred  Fetal monitoring:bedside US performed, + FM noted, SIUP with FHR 161bpm Uterine activity: pt denies cramping.   Results for orders placed or performed during the hospital encounter of 05/13/21 (from the past 48 hour(s))  Glucose, capillary     Status: Abnormal   Collection Time: 05/13/21  6:39 PM  Result Value Ref Range   Glucose-Capillary 148 (H) 70 - 99 mg/dL    Comment: Glucose reference range applies only to samples taken after fasting for at least 8 hours.  CBC     Status: Abnormal   Collection Time: 05/13/21  6:46 PM  Result Value Ref Range   WBC 10.8 (H) 4.0 - 10.5 K/uL   RBC 4.41 3.87 - 5.11 MIL/uL   Hemoglobin 13.8 12.0 - 15.0 g/dL   HCT 14.7 82.9 - 56.2 %   MCV 87.8 80.0 - 100.0 fL   MCH 31.3 26.0 - 34.0 pg   MCHC 35.7 30.0  - 36.0 g/dL   RDW 13.0 86.5 - 78.4 %   Platelets 337 150 - 400 K/uL   nRBC 0.0 0.0 - 0.2 %    Comment: Performed at [facility], [address] [place], [place] [address]  Comprehensive metabolic panel     Status: Abnormal   Collection Time: 05/13/21  6:46 PM  Result Value Ref Range   Sodium 133 (L) 135 - 145 mmol/L   Potassium 3.8 3.5 - 5.1 mmol/L   Chloride 103 98 - 111 mmol/L   CO2 24 22 - 32 mmol/L   Glucose, Bld 146 (H) 70 - 99 mg/dL    Comment: Glucose reference range applies only to samples taken after fasting for at least 8 hours.   BUN 9 6 - 20 mg/dL   Creatinine, Ser 5.28 0.44 - 1.00 mg/dL   Calcium 9.0 8.9 - 41.3 mg/dL   Total Protein 7.3 6.5 - 8.1 g/dL   Albumin 3.8 3.5 - 5.0 g/dL   AST 13 (L) 15 - 41 U/L   ALT 16 0 - 44 U/L   Alkaline Phosphatase 64 38 - 126 U/L   Total Bilirubin 0.7 0.3 - 1.2 mg/dL   GFR, Estimated >24 >40 mL/min    Comment: (NOTE) Calculated using the CKD-EPI Creatinine Equation (2021)    Anion gap 6 5 - 15  Comment: Performed at [facility], [address] [place], [place] [address]  TSH     Status: None   Collection Time: 05/13/21  6:46 PM  Result Value Ref Range   TSH 2.642 0.350 - 4.500 uIU/mL    Comment: Performed by a 3rd Generation assay with a functional sensitivity of <=0.01 uIU/mL. Performed at [facility], [address] [place], [place] [address]   Magnesium     Status: None   Collection Time: 05/13/21  6:46 PM  Result Value Ref Range   Magnesium 2.0 1.7 - 2.4 mg/dL    Comment: Performed at [facility], [address] [place], [place] [address]  Phosphorus     Status: None   Collection Time: 05/13/21  6:46 PM  Result Value Ref Range   Phosphorus 4.5 2.5 - 4.6 mg/dL    Comment: Performed at [facility], [address] [place], [place] [address]  Resp Panel by RT-PCR (Flu A&B, Covid) Nasopharyngeal Swab     Status: None   Collection Time: 05/13/21  7:00 PM    Specimen: Nasopharyngeal Swab; Nasopharyngeal(NP) swabs in vial transport medium  Result Value Ref Range   SARS Coronavirus 2 by RT PCR NEGATIVE NEGATIVE    Comment: (NOTE) SARS-CoV-2 target nucleic acids are NOT DETECTED.  The SARS-CoV-2 RNA is generally detectable in upper respiratory specimens during the acute phase of infection. The lowest concentration of SARS-CoV-2 viral copies this assay can detect is 138 copies/mL. A negative result does not preclude SARS-Cov-2 infection and should not be used as the sole basis for treatment or other patient management decisions. A negative result may occur with  improper specimen collection/handling, submission of specimen other than nasopharyngeal swab, presence of viral mutation(s) within the areas targeted by this assay, and inadequate number of viral copies(<138 copies/mL). A negative result must be combined with clinical observations, patient history, and epidemiological information. The expected result is Negative.  Fact Sheet for Patients:  BloggerCourse.com  Fact Sheet for Healthcare Providers:  SeriousBroker.it  This test is no t yet approved or cleared by the [place] FDA and  has been authorized for detection and/or diagnosis of SARS-CoV-2 by FDA under an Emergency Use Authorization (EUA). This EUA will remain  in effect (meaning this test can be used) for the duration of the COVID-19 declaration under Section 564(b)(1) of the Act, 21 U.S.C.section 360bbb-3(b)(1), unless the authorization is terminated  or revoked sooner.       Influenza A by PCR NEGATIVE NEGATIVE   Influenza B by PCR NEGATIVE NEGATIVE    Comment: (NOTE) The Xpert Xpress SARS-CoV-2/FLU/RSV plus assay is intended as an aid in the diagnosis of influenza from Nasopharyngeal swab specimens and should not be used as a sole basis for treatment. Nasal washings and aspirates are unacceptable for Xpert Xpress  SARS-CoV-2/FLU/RSV testing.  Fact Sheet for Patients: BloggerCourse.com  Fact Sheet for Healthcare Providers: SeriousBroker.it  This test is not yet approved or cleared by the [place] FDA and has been authorized for detection and/or diagnosis of SARS-CoV-2 by FDA under an Emergency Use Authorization (EUA). This EUA will remain in effect (meaning this test can be used) for the duration of the COVID-19 declaration under Section 564(b)(1) of the Act, 21 U.S.C. section 360bbb-3(b)(1), unless the authorization is terminated or revoked.  Performed at [facility], [address] [place], [place] [address]   Glucose, capillary     Status: Abnormal   Collection Time: 05/13/21  8:02  PM  Result Value Ref Range   Glucose-Capillary 65 (L) 70 - 99 mg/dL    Comment: Glucose reference range applies only to samples taken after fasting for at least 8 hours.  Glucose, capillary     Status: Abnormal   Collection Time: 05/13/21  9:12 PM  Result Value Ref Range   Glucose-Capillary 49 (L) 70 - 99 mg/dL    Comment: Glucose reference range applies only to samples taken after fasting for at least 8 hours.   Comment 1 Notify RN   Glucose, capillary     Status: Abnormal   Collection Time: 05/13/21  9:57 PM  Result Value Ref Range   Glucose-Capillary 236 (H) 70 - 99 mg/dL    Comment: Glucose reference range applies only to samples taken after fasting for at least 8 hours.  Glucose, capillary     Status: Abnormal   Collection Time: 05/13/21 10:18 PM  Result Value Ref Range   Glucose-Capillary 213 (H) 70 - 99 mg/dL    Comment: Glucose reference range applies only to samples taken after fasting for at least 8 hours.  Glucose, capillary     Status: Abnormal   Collection Time: 05/13/21 10:51 PM  Result Value Ref Range   Glucose-Capillary 216 (H) 70 - 99 mg/dL    Comment: Glucose reference range applies only to samples taken after fasting  for at least 8 hours.  Glucose, capillary     Status: Abnormal   Collection Time: 05/13/21 11:24 PM  Result Value Ref Range   Glucose-Capillary 192 (H) 70 - 99 mg/dL    Comment: Glucose reference range applies only to samples taken after fasting for at least 8 hours.  Glucose, capillary     Status: Abnormal   Collection Time: 05/14/21 12:31 AM  Result Value Ref Range   Glucose-Capillary 120 (H) 70 - 99 mg/dL    Comment: Glucose reference range applies only to samples taken after fasting for at least 8 hours.  Glucose, capillary     Status: None   Collection Time: 05/14/21  1:32 AM  Result Value Ref Range   Glucose-Capillary 83 70 - 99 mg/dL    Comment: Glucose reference range applies only to samples taken after fasting for at least 8 hours.  Glucose, capillary     Status: Abnormal   Collection Time: 05/14/21  2:40 AM  Result Value Ref Range   Glucose-Capillary 127 (H) 70 - 99 mg/dL    Comment: Glucose reference range applies only to samples taken after fasting for at least 8 hours.  Urinalysis, Routine w reflex microscopic     Status: Abnormal   Collection Time: 05/14/21  2:44 AM  Result Value Ref Range   Color, Urine YELLOW (A) YELLOW   APPearance CLEAR (A) CLEAR   Specific Gravity, Urine 1.010 1.005 - 1.030   pH 6.0 5.0 - 8.0   Glucose, UA >=500 (A) NEGATIVE mg/dL   Hgb urine dipstick NEGATIVE NEGATIVE   Bilirubin Urine NEGATIVE NEGATIVE   Ketones, ur 20 (A) NEGATIVE mg/dL   Protein, ur NEGATIVE NEGATIVE mg/dL   Nitrite NEGATIVE NEGATIVE   Leukocytes,Ua NEGATIVE NEGATIVE   RBC / HPF 0-5 0 - 5 RBC/hpf   WBC, UA 0-5 0 - 5 WBC/hpf   Bacteria, UA RARE (A) NONE SEEN   Squamous Epithelial / LPF 0-5 0 - 5   Mucus PRESENT     Comment: Performed at [facility], [address] [place], [place] [address]  Glucose, capillary  Status: None   Collection Time: 05/14/21  3:51 AM  Result Value Ref Range   Glucose-Capillary 90 70 - 99 mg/dL    Comment: Glucose reference  range applies only to samples taken after fasting for at least 8 hours.  Glucose, capillary     Status: None   Collection Time: 05/14/21  4:55 AM  Result Value Ref Range   Glucose-Capillary 86 70 - 99 mg/dL    Comment: Glucose reference range applies only to samples taken after fasting for at least 8 hours.  Basic metabolic panel     Status: Abnormal   Collection Time: 05/14/21  5:41 AM  Result Value Ref Range   Sodium 134 (L) 135 - 145 mmol/L   Potassium 3.6 3.5 - 5.1 mmol/L   Chloride 105 98 - 111 mmol/L   CO2 22 22 - 32 mmol/L   Glucose, Bld 121 (H) 70 - 99 mg/dL    Comment: Glucose reference range applies only to samples taken after fasting for at least 8 hours.   BUN 7 6 - 20 mg/dL   Creatinine, Ser 6.60 0.44 - 1.00 mg/dL   Calcium 8.2 (L) 8.9 - 10.3 mg/dL   GFR, Estimated >60 >04 mL/min    Comment: (NOTE) Calculated using the CKD-EPI Creatinine Equation (2021)    Anion gap 7 5 - 15    Comment: Performed at [facility], [address] [place], [place] [address]  Glucose, capillary     Status: Abnormal   Collection Time: 05/14/21  5:54 AM  Result Value Ref Range   Glucose-Capillary 119 (H) 70 - 99 mg/dL    Comment: Glucose reference range applies only to samples taken after fasting for at least 8 hours.  Glucose, capillary     Status: Abnormal   Collection Time: 05/14/21  7:13 AM  Result Value Ref Range   Glucose-Capillary 135 (H) 70 - 99 mg/dL    Comment: Glucose reference range applies only to samples taken after fasting for at least 8 hours.  Glucose, capillary     Status: Abnormal   Collection Time: 05/14/21  8:24 AM  Result Value Ref Range   Glucose-Capillary 124 (H) 70 - 99 mg/dL    Comment: Glucose reference range applies only to samples taken after fasting for at least 8 hours.  Glucose, capillary     Status: Abnormal   Collection Time: 05/14/21  9:30 AM  Result Value Ref Range   Glucose-Capillary 130 (H) 70 - 99 mg/dL    Comment: Glucose reference  range applies only to samples taken after fasting for at least 8 hours.    No results found.  Current scheduled medications  dextrose  12.5 g Intravenous STAT   famotidine  20 mg Oral Q12H   glucagon (human recombinant)  1 mg Intramuscular STAT   glucagon (human recombinant)  1 mg Intramuscular STAT   metoCLOPramide  10 mg Oral Q6H   Or   metoCLOPramide (REGLAN) injection  10 mg Intravenous Q6H   ondansetron  4 mg Oral Q6H   Or   ondansetron (ZOFRAN) IV  4 mg Intravenous Q6H    I have reviewed the patient's current medications.  ASSESSMENT: Patient Active Problem List   Diagnosis Date Noted   Hyperemesis complicating pregnancy, antepartum 05/14/2021   Hyperemesis 04/23/2021   Hyperemesis gravidarum 04/23/2021   Type 1 diabetes mellitus with hyperglycemia (HCC) 04/22/2021   Supervision of high risk pregnancy in first trimester 04/12/2021   [redacted] weeks gestation of pregnancy  Abnormal findings on antenatal screening    Anxiety 10/18/2015    PLAN: HG- scheduled antiemetics Zofran, Reglan and Pepcid ordered. IV ordered if unable to tolerate PO.  Type 1 DM- continue Endotool with q4h CBG, insulin drip. Diabetic coordinator paged. Insulin pump remains off for now.  Continue routine antenatal care.   Prudencio Pair Jamarious Febo, CNM 05/14/2021  10:00 AM

## 2021-05-14 NOTE — Progress Notes (Signed)
Diabetic coordinator paged twice without response.   Consult with Dr Valentino Saxon, CBG have been stable on insulin drip today; pt to transition to q4hr CBG with sensitive sliding scale coverage.   Pt has tolerated sips of PO fluids, still reports nausea but no vomiting.   Randa Ngo, CNM 05/14/2021 4:50 PM

## 2021-05-14 NOTE — Progress Notes (Signed)
Inpatient Diabetes Program Recommendations  AACE/ADA: New Consensus Statement on Inpatient Glycemic Control (2015)  Target Ranges:  Prepandial:   less than 140 mg/dL      Peak postprandial:   less than 180 mg/dL (1-2 hours)      Critically ill patients:  140 - 180 mg/dL   Lab Results  Component Value Date   GLUCAP 145 (H) 05/14/2021   HGBA1C 5.9 (H) 04/22/2021    Review of Glycemic Control  Diabetes history: DM1 Outpatient Diabetes medications: insulin pump Current orders for Inpatient glycemic control: IV insulin transitioning to Novolog 0-14 units Q4H  ADA Standards of Care 2022 Diabetes in Pregnancy Target Glucose Ranges:   Fasting: 60 - 90 mg/dL Preprandial: 60 - 256 mg/dL 1 hr postprandial: Less than 140mg /dL (from first bite of meal) 2 hr postprandial: Less than 120 mg/dL (from first bit of meal)   In past 12H, pt has received 13.7 units of IV insulin. Pt is no longer vomiting, but continues with nausea with no po intake  Inpatient Diabetes Program Recommendations:    Levemir 12 units BID Continue Novolog 0-14 units Q4H since pt not tolerayting po's  Will reassess in am.  Thank you. , RD, LDN, CDE Inpatient Diabetes Coordinator [phone]

## 2021-05-14 NOTE — Progress Notes (Signed)
Patient has had nausea but no vomiting this shift. IV Zofran and Reglan given. No vaginal bleeding or LOF. Patient reports no pain.   FHT ordered per shift. RN could not find FHT with doppler. CNM, Donato Schultz, in department and attempted to assess FHT with doppler. CNM unsuccessful and used bedside ultrasound on patient.  Patient's CBG at 20:02 was 65. Patient has been NPO since arrival to mother-baby unit. CNM, Donato Schultz, aware and in patient room at the time of testing. Patient placed in hypoglycemia protocol. Dextrose 50% ordered by CNM. At 20:30 RN attempted to push dextrose but was unsuccessful, as IV gauge placed by IV team was not large enough (20 gauge was documented but IV appears to be 22 gauge). CNM alerted and glucose gel was ordered since patient stated she was not currently nauseous. Glucose gel given PO at 20:57. 15 minute recheck of CBG after glucose gel was given was 49. Patient has been very fatigued but no other hypoglycemic symptoms at this time. Vitals were stable and WNL. RN called CNM about 49 CBG at 21:18. CNM ordered IM Glucagon to be given if larger bore IV could not be placed. RN instructed patient to turn off insulin pump at this time, per provider. CNM to patient room. 18 gauge IV placed by R. Larita Fife, RN. Dextrose 50% given via IV push at 21:31 (finished at 21:37). IM glucagon was held.  Patient transferred to birthplace, via bed, to be placed on endo tool at 21:50. Report was given to Kae Heller, RN.

## 2021-05-15 LAB — GLUCOSE, CAPILLARY
Glucose-Capillary: 132 mg/dL — ABNORMAL HIGH (ref 70–99)
Glucose-Capillary: 113 mg/dL — ABNORMAL HIGH (ref 70–99)
Glucose-Capillary: 234 mg/dL — ABNORMAL HIGH (ref 70–99)
Glucose-Capillary: 110 mg/dL — ABNORMAL HIGH (ref 70–99)
Glucose-Capillary: 115 mg/dL — ABNORMAL HIGH (ref 70–99)

## 2021-05-15 LAB — BASIC METABOLIC PANEL
Anion gap: 4 — ABNORMAL LOW (ref 5–15)
BUN: <5 mg/dL — ABNORMAL LOW (ref 6–20)
CO2: 21 mmol/L — ABNORMAL LOW (ref 22–32)
Calcium: 8.1 mg/dL — ABNORMAL LOW (ref 8.9–10.3)
Chloride: 106 mmol/L (ref 98–111)
Creatinine, Ser: 0.5 mg/dL (ref 0.44–1.00)
GFR, Estimated: >60 mL/min (ref >60)
Glucose, Bld: 114 mg/dL — ABNORMAL HIGH (ref 70–99)
Potassium: 3.4 mmol/L — ABNORMAL LOW (ref 3.5–5.1)
Sodium: 131 mmol/L — ABNORMAL LOW (ref 135–145)

## 2021-05-15 MED ORDER — LEVEMIR FLEXTOUCH 100 UNIT/ML ~~LOC~~ SOPN: 15.0000 [IU] | PEN_INJECTOR | Freq: Two times a day (BID) | SUBCUTANEOUS | 2 refills | Status: DC

## 2021-05-15 MED ORDER — PROMETHAZINE HCL 12.5 MG PO TABS: 12.5000 mg | ORAL_TABLET | ORAL | 0 refills | Status: DC | PRN

## 2021-05-15 MED ORDER — INSULIN ASPART 100 UNIT/ML IJ SOLN
0.0000 [IU] | INTRAMUSCULAR | Status: DC
  Administered 2021-05-15: 15:00:00 1 [IU] via SUBCUTANEOUS

## 2021-05-15 MED ORDER — LACTATED RINGERS IV SOLN: INTRAVENOUS | Status: DC

## 2021-05-15 MED ORDER — NOVOLOG FLEXPEN 100 UNIT/ML ~~LOC~~ SOPN: PEN_INJECTOR | SUBCUTANEOUS | 2 refills | Status: DC

## 2021-05-15 MED ORDER — INSULIN ASPART 100 UNIT/ML IJ SOLN
0.0000 [IU] | Freq: Three times a day (TID) | INTRAMUSCULAR | Status: DC
  Administered 2021-05-15: 20:00:00 3 [IU] via SUBCUTANEOUS
  Filled 2021-05-15 (×2): qty 1

## 2021-05-15 MED ORDER — METOCLOPRAMIDE HCL 10 MG PO TABS: 10.0000 mg | ORAL_TABLET | Freq: Four times a day (QID) | ORAL | 0 refills | Status: DC

## 2021-05-15 MED ORDER — INSULIN DETEMIR 100 UNIT/ML ~~LOC~~ SOLN
15.0000 [IU] | Freq: Two times a day (BID) | SUBCUTANEOUS | Status: DC
  Filled 2021-05-15 (×2): qty 0.15

## 2021-05-15 MED ORDER — FAMOTIDINE 20 MG PO TABS: 20.0000 mg | ORAL_TABLET | Freq: Two times a day (BID) | ORAL | 1 refills | Status: DC

## 2021-05-15 MED ORDER — HYDROXYZINE HCL 50 MG PO TABS: 50.0000 mg | ORAL_TABLET | Freq: Four times a day (QID) | ORAL | 0 refills | Status: DC | PRN

## 2021-05-15 MED ORDER — ONDANSETRON HCL 4 MG PO TABS: 4.0000 mg | ORAL_TABLET | Freq: Four times a day (QID) | ORAL | 0 refills | Status: DC

## 2021-05-15 MED ORDER — "PEN NEEDLES 3/16"" 31G X 5 MM MISC": 6 refills | Status: DC

## 2021-05-15 NOTE — Discharge Summary (Signed)
Patient ID: Cheryl Avila MRN: [id number] DOB/AGE: [birth year] 25 y.o.  Admit date: 05/13/2021 Discharge date: 05/15/2021  Admission Diagnoses: Hyperemesis Gravidarum, T1DM, dehydration, [redacted]wks gestation  Discharge Diagnoses: same  Prenatal Care Site: [facility]  Prenatal Procedures: bedside US, IV insulin Gtt and IV hydration.   Consults: Diabetes Coordinator  Significant Diagnostic Studies:  Results for orders placed or performed during the hospital encounter of 05/13/21 (from the past 168 hour(s))  Glucose, capillary   Collection Time: 05/13/21  6:39 PM  Result Value Ref Range   Glucose-Capillary 148 (H) 70 - 99 mg/dL  CBC   Collection Time: 05/13/21  6:46 PM  Result Value Ref Range   WBC 10.8 (H) 4.0 - 10.5 K/uL   RBC 4.41 3.87 - 5.11 MIL/uL   Hemoglobin 13.8 12.0 - 15.0 g/dL   HCT 69.6 29.5 - 28.4 %   MCV 87.8 80.0 - 100.0 fL   MCH 31.3 26.0 - 34.0 pg   MCHC 35.7 30.0 - 36.0 g/dL   RDW 13.2 44.0 - 10.2 %   Platelets 337 150 - 400 K/uL   nRBC 0.0 0.0 - 0.2 %  Comprehensive metabolic panel   Collection Time: 05/13/21  6:46 PM  Result Value Ref Range   Sodium 133 (L) 135 - 145 mmol/L   Potassium 3.8 3.5 - 5.1 mmol/L   Chloride 103 98 - 111 mmol/L   CO2 24 22 - 32 mmol/L   Glucose, Bld 146 (H) 70 - 99 mg/dL   BUN 9 6 - 20 mg/dL   Creatinine, Ser 7.25 0.44 - 1.00 mg/dL   Calcium 9.0 8.9 - 36.6 mg/dL   Total Protein 7.3 6.5 - 8.1 g/dL   Albumin 3.8 3.5 - 5.0 g/dL   AST 13 (L) 15 - 41 U/L   ALT 16 0 - 44 U/L   Alkaline Phosphatase 64 38 - 126 U/L   Total Bilirubin 0.7 0.3 - 1.2 mg/dL   GFR, Estimated >44 >03 mL/min   Anion gap 6 5 - 15  TSH   Collection Time: 05/13/21  6:46 PM  Result Value Ref Range   TSH 2.642 0.350 - 4.500 uIU/mL  Magnesium   Collection Time: 05/13/21  6:46 PM  Result Value Ref Range   Magnesium 2.0 1.7 - 2.4 mg/dL  Phosphorus   Collection Time: 05/13/21  6:46 PM  Result Value Ref Range   Phosphorus 4.5 2.5 - 4.6 mg/dL   Resp Panel by RT-PCR (Flu A&B, Covid) Nasopharyngeal Swab   Collection Time: 05/13/21  7:00 PM   Specimen: Nasopharyngeal Swab; Nasopharyngeal(NP) swabs in vial transport medium  Result Value Ref Range   SARS Coronavirus 2 by RT PCR NEGATIVE NEGATIVE   Influenza A by PCR NEGATIVE NEGATIVE   Influenza B by PCR NEGATIVE NEGATIVE  Glucose, capillary   Collection Time: 05/13/21  8:02 PM  Result Value Ref Range   Glucose-Capillary 65 (L) 70 - 99 mg/dL  Glucose, capillary   Collection Time: 05/13/21  9:12 PM  Result Value Ref Range   Glucose-Capillary 49 (L) 70 - 99 mg/dL   Comment 1 Notify RN   Glucose, capillary   Collection Time: 05/13/21  9:57 PM  Result Value Ref Range   Glucose-Capillary 236 (H) 70 - 99 mg/dL  Glucose, capillary   Collection Time: 05/13/21 10:18 PM  Result Value Ref Range   Glucose-Capillary 213 (H) 70 - 99 mg/dL  Glucose, capillary   Collection Time: 05/13/21 10:51 PM  Result Value Ref Range  Glucose-Capillary 216 (H) 70 - 99 mg/dL  Glucose, capillary   Collection Time: 05/13/21 11:24 PM  Result Value Ref Range   Glucose-Capillary 192 (H) 70 - 99 mg/dL  Glucose, capillary   Collection Time: 05/14/21 12:31 AM  Result Value Ref Range   Glucose-Capillary 120 (H) 70 - 99 mg/dL  Glucose, capillary   Collection Time: 05/14/21  1:32 AM  Result Value Ref Range   Glucose-Capillary 83 70 - 99 mg/dL  Glucose, capillary   Collection Time: 05/14/21  2:40 AM  Result Value Ref Range   Glucose-Capillary 127 (H) 70 - 99 mg/dL  Urinalysis, Routine w reflex microscopic   Collection Time: 05/14/21  2:44 AM  Result Value Ref Range   Color, Urine YELLOW (A) YELLOW   APPearance CLEAR (A) CLEAR   Specific Gravity, Urine 1.010 1.005 - 1.030   pH 6.0 5.0 - 8.0   Glucose, UA >=500 (A) NEGATIVE mg/dL   Hgb urine dipstick NEGATIVE NEGATIVE   Bilirubin Urine NEGATIVE NEGATIVE   Ketones, ur 20 (A) NEGATIVE mg/dL   Protein, ur NEGATIVE NEGATIVE mg/dL   Nitrite NEGATIVE  NEGATIVE   Leukocytes,Ua NEGATIVE NEGATIVE   RBC / HPF 0-5 0 - 5 RBC/hpf   WBC, UA 0-5 0 - 5 WBC/hpf   Bacteria, UA RARE (A) NONE SEEN   Squamous Epithelial / LPF 0-5 0 - 5   Mucus PRESENT   Glucose, capillary   Collection Time: 05/14/21  3:51 AM  Result Value Ref Range   Glucose-Capillary 90 70 - 99 mg/dL  Glucose, capillary   Collection Time: 05/14/21  4:55 AM  Result Value Ref Range   Glucose-Capillary 86 70 - 99 mg/dL  Basic metabolic panel   Collection Time: 05/14/21  5:41 AM  Result Value Ref Range   Sodium 134 (L) 135 - 145 mmol/L   Potassium 3.6 3.5 - 5.1 mmol/L   Chloride 105 98 - 111 mmol/L   CO2 22 22 - 32 mmol/L   Glucose, Bld 121 (H) 70 - 99 mg/dL   BUN 7 6 - 20 mg/dL   Creatinine, Ser 7.41 0.44 - 1.00 mg/dL   Calcium 8.2 (L) 8.9 - 10.3 mg/dL   GFR, Estimated >42 >39 mL/min   Anion gap 7 5 - 15  Glucose, capillary   Collection Time: 05/14/21  5:54 AM  Result Value Ref Range   Glucose-Capillary 119 (H) 70 - 99 mg/dL  Glucose, capillary   Collection Time: 05/14/21  7:13 AM  Result Value Ref Range   Glucose-Capillary 135 (H) 70 - 99 mg/dL  Glucose, capillary   Collection Time: 05/14/21  8:24 AM  Result Value Ref Range   Glucose-Capillary 124 (H) 70 - 99 mg/dL  Glucose, capillary   Collection Time: 05/14/21  9:30 AM  Result Value Ref Range   Glucose-Capillary 130 (H) 70 - 99 mg/dL  Urinalysis, Routine w reflex microscopic Urine, Clean Catch   Collection Time: 05/14/21  9:42 AM  Result Value Ref Range   Color, Urine YELLOW (A) YELLOW   APPearance CLEAR (A) CLEAR   Specific Gravity, Urine 1.013 1.005 - 1.030   pH 8.0 5.0 - 8.0   Glucose, UA NEGATIVE NEGATIVE mg/dL   Hgb urine dipstick NEGATIVE NEGATIVE   Bilirubin Urine NEGATIVE NEGATIVE   Ketones, ur NEGATIVE NEGATIVE mg/dL   Protein, ur NEGATIVE NEGATIVE mg/dL   Nitrite NEGATIVE NEGATIVE   Leukocytes,Ua NEGATIVE NEGATIVE  Glucose, capillary   Collection Time: 05/14/21 10:57 AM  Result Value Ref  Range   Glucose-Capillary 103 (H) 70 - 99 mg/dL  Glucose, capillary   Collection Time: 05/14/21 12:02 PM  Result Value Ref Range   Glucose-Capillary 123 (H) 70 - 99 mg/dL  Glucose, capillary   Collection Time: 05/14/21  1:05 PM  Result Value Ref Range   Glucose-Capillary 100 (H) 70 - 99 mg/dL  Glucose, capillary   Collection Time: 05/14/21  2:13 PM  Result Value Ref Range   Glucose-Capillary 112 (H) 70 - 99 mg/dL  Glucose, capillary   Collection Time: 05/14/21  3:23 PM  Result Value Ref Range   Glucose-Capillary 130 (H) 70 - 99 mg/dL  Glucose, capillary   Collection Time: 05/14/21  4:40 PM  Result Value Ref Range   Glucose-Capillary 145 (H) 70 - 99 mg/dL  Glucose, capillary   Collection Time: 05/14/21  8:29 PM  Result Value Ref Range   Glucose-Capillary 151 (H) 70 - 99 mg/dL  Glucose, capillary   Collection Time: 05/14/21 11:32 PM  Result Value Ref Range   Glucose-Capillary 177 (H) 70 - 99 mg/dL  Glucose, capillary   Collection Time: 05/15/21  4:18 AM  Result Value Ref Range   Glucose-Capillary 132 (H) 70 - 99 mg/dL  Basic metabolic panel   Collection Time: 05/15/21  7:00 AM  Result Value Ref Range   Sodium 131 (L) 135 - 145 mmol/L   Potassium 3.4 (L) 3.5 - 5.1 mmol/L   Chloride 106 98 - 111 mmol/L   CO2 21 (L) 22 - 32 mmol/L   Glucose, Bld 114 (H) 70 - 99 mg/dL   BUN <5 (L) 6 - 20 mg/dL   Creatinine, Ser 3.29 0.44 - 1.00 mg/dL   Calcium 8.1 (L) 8.9 - 10.3 mg/dL   GFR, Estimated >51 >88 mL/min   Anion gap 4 (L) 5 - 15  Glucose, capillary   Collection Time: 05/15/21  7:42 AM  Result Value Ref Range   Glucose-Capillary 113 (H) 70 - 99 mg/dL  Glucose, capillary   Collection Time: 05/15/21 12:01 PM  Result Value Ref Range   Glucose-Capillary 234 (H) 70 - 99 mg/dL  Glucose, capillary   Collection Time: 05/15/21  2:26 PM  Result Value Ref Range   Glucose-Capillary 110 (H) 70 - 99 mg/dL  Glucose, capillary   Collection Time: 05/15/21  4:54 PM  Result Value Ref  Range   Glucose-Capillary 115 (H) 70 - 99 mg/dL  Results for orders placed or performed during the hospital encounter of 05/09/21 (from the past 168 hour(s))  Glucose, capillary   Collection Time: 05/09/21  1:42 PM  Result Value Ref Range   Glucose-Capillary 51 (L) 70 - 99 mg/dL  Glucose, capillary   Collection Time: 05/09/21  2:41 PM  Result Value Ref Range   Glucose-Capillary 144 (H) 70 - 99 mg/dL    Treatments: IV hydration, insulin: Novolog and Levemir, and IV antemetics  Hospital Course:  This is a 25 y.o. [id number] with IUP at [redacted]w[redacted]d admitted for Hyperemesis gravidarum and dehydration.  No leaking of fluid and no bleeding.  On day of admission, She was initially transitioned from insulin pump to IV insulin and antiemetics until she was able to tolerate PO intake. She was transitioned back to [facility] insulin dosing with input from Inpatient diabetic coordinator when she was able to tolerate PO intake. On HD2, she was able to tolerate PO anti-emetics and bland diet. She was deemed stable for discharge to home with outpatient follow up.  Discharge Physical Exam:  BP 123/82 (  BP Location: Right Arm)   Pulse 85   Temp 98.3 F (36.8 C) (Oral)   Resp 18   Ht 5' (1.524 m)   Wt 99.1 kg   LMP 01/27/2021 (Approximate)   SpO2 100%   BMI 42.67 kg/m   General: NAD CV: RRR Pulm: CTABL, nl effort ABD: s/nd/nt, gravid DVT Evaluation: LE non-ttp, no evidence of DVT on exam.   Discharge Condition: Stable PLAN: -Per diabetic coordinator, Patient to restart insulin pump at current settings. -patient to contact [name] office for insulin pump followup if office does contact her -Hypoglycemic protocol reviewed, patient verbalized understanding -Patient sent home with scheduled PO Pepcid, Zofran and Reglan  -patient to f/u with scheduled MFM  appointment 05/26/21 -patient to f/u with scheduled OB appointment for new OB appt at [facility].  Disposition: Discharge disposition: 01-Home or Self  Care     Home to self care   Allergies as of 05/15/2021       Reactions   Aleve [naproxen] Hives        Medication List     STOP taking these medications    ondansetron 4 MG disintegrating tablet Commonly known as: Zofran ODT       TAKE these medications    acetaminophen 325 MG tablet Commonly known as: TYLENOL Take 2 tablets (650 mg total) by mouth every 4 (four) hours as needed (for pain scale < 4  OR  temperature  >/=  100.5 F).   famotidine 20 MG tablet Commonly known as: PEPCID Take 1 tablet (20 mg total) by mouth every 12 (twelve) hours.   folic acid 1 MG tablet Commonly known as: FOLVITE Take 1 mg by mouth daily.   hydrOXYzine 50 MG tablet Commonly known as: ATARAX Take 1 tablet (50 mg total) by mouth every 6 (six) hours as needed for nausea or vomiting.   Levemir FlexTouch 100 UNIT/ML FlexPen Generic drug: insulin detemir Inject 15 Units into the skin 2 (two) times daily.   metoCLOPramide 10 MG tablet Commonly known as: REGLAN Take 1 tablet (10 mg total) by mouth every 6 (six) hours.   NovoLOG FlexPen 100 UNIT/ML FlexPen Generic drug: insulin aspart Novolog 1 unit for every 20 mg/dL > 413 mg/dL TID ac, hs and 2 am; Novolog 1 units for every 8 grams of carbohydrate when eating What changed:  how much to take how to take this when to take this additional instructions   ondansetron 4 MG tablet Commonly known as: ZOFRAN Take 1 tablet (4 mg total) by mouth every 6 (six) hours.   Pen Needles 3/16" 31G X 5 MM Misc Use as directed   Prenatal 28-0.8 MG Tabs Take 1 tablet by mouth daily.   promethazine 12.5 MG tablet Commonly known as: PHENERGAN Take 1-2 tablets (12.5-25 mg total) by mouth every 4 (four) hours as needed for nausea or vomiting.        Follow-up Information     Sherlon Handing, MD. Schedule an appointment as soon as possible for a visit in 1 day(s).   Specialties: Internal Medicine, Endocrinology Why: make sure you  touch base with Dr Gershon Crane tomorrow to discuss insulin pump settings Contact information: [address] [place] [address] [phone]         [facility] Follow up.   Why: Keep scheduled new Ob appt Contact information: [address] [place] [address] [phone]  Signed:  Randa Ngo, CNM  Certified Nurse Midwife [facility]

## 2021-05-15 NOTE — Progress Notes (Addendum)
ANTEPARTUM PROGRESS NOTE  Cheryl Avila is a 25 y.o. [id number] at [redacted]w[redacted]d with [facility] Estimated Date of Delivery: 11/25/21 who is admitted for Hyperemesis gravidarum and type 1 DM.   Length of Stay:  2 Days. Admitted 05/13/2021  Subjective: Has been tolerating clear fluids, and has now tried plain cereal with milk.  She reports no uterine contractions, no bleeding and no loss of fluid per vagina.  Vitals:  BP (!) 97/45   Pulse 82   Temp 98 F (36.7 C) (Oral)   Resp 17   Ht 5' (1.524 m)   Wt 99.1 kg   LMP 01/27/2021 (Approximate)   SpO2 100%   BMI 42.67 kg/m   Physical Examination: CONSTITUTIONAL: Well-developed, well-nourished female in no acute distress.  HENT:  Normocephalic, atraumatic NECK: Normal range of motion SKIN: Skin is warm and dry. NEUROLGIC: Alert and oriented to person, place, and time.  PSYCHIATRIC: Normal mood and affect. Normal behavior. Normal judgment and thought content. MUSCULOSKELETAL: Normal range of motion. No edema and no tenderness. 2+ distal pulses. ABDOMEN: Soft, nontender, nondistended, gravid. CERVIX: deferred  Fetal monitoring: unable to obtain via doppler, FHR via bedside US 171bpm, active FM noted.  Uterine activity: pt denies cramping.   Results for orders placed or performed during the hospital encounter of 05/13/21 (from the past 48 hour(s))  Glucose, capillary     Status: Abnormal   Collection Time: 05/13/21  6:39 PM  Result Value Ref Range   Glucose-Capillary 148 (H) 70 - 99 mg/dL    Comment: Glucose reference range applies only to samples taken after fasting for at least 8 hours.  CBC     Status: Abnormal   Collection Time: 05/13/21  6:46 PM  Result Value Ref Range   WBC 10.8 (H) 4.0 - 10.5 K/uL   RBC 4.41 3.87 - 5.11 MIL/uL   Hemoglobin 13.8 12.0 - 15.0 g/dL   HCT 43.1 54.0 - 08.6 %   MCV 87.8 80.0 - 100.0 fL   MCH 31.3 26.0 - 34.0 pg   MCHC 35.7 30.0 - 36.0 g/dL   RDW 76.1 95.0 - 93.2 %   Platelets 337 150 - 400 K/uL    nRBC 0.0 0.0 - 0.2 %    Comment: Performed at [facility], [address] [place], [place] [address]  Comprehensive metabolic panel     Status: Abnormal   Collection Time: 05/13/21  6:46 PM  Result Value Ref Range   Sodium 133 (L) 135 - 145 mmol/L   Potassium 3.8 3.5 - 5.1 mmol/L   Chloride 103 98 - 111 mmol/L   CO2 24 22 - 32 mmol/L   Glucose, Bld 146 (H) 70 - 99 mg/dL    Comment: Glucose reference range applies only to samples taken after fasting for at least 8 hours.   BUN 9 6 - 20 mg/dL   Creatinine, Ser 5.80 0.44 - 1.00 mg/dL   Calcium 9.0 8.9 - 99.8 mg/dL   Total Protein 7.3 6.5 - 8.1 g/dL   Albumin 3.8 3.5 - 5.0 g/dL   AST 13 (L) 15 - 41 U/L   ALT 16 0 - 44 U/L   Alkaline Phosphatase 64 38 - 126 U/L   Total Bilirubin 0.7 0.3 - 1.2 mg/dL   GFR, Estimated >33 >82 mL/min    Comment: (NOTE) Calculated using the CKD-EPI Creatinine Equation (2021)    Anion gap 6 5 - 15    Comment: Performed at [facility], [address] [place], [place] [address]  TSH     Status: None   Collection Time: 05/13/21  6:46 PM  Result Value Ref Range   TSH 2.642 0.350 - 4.500 uIU/mL    Comment: Performed by a 3rd Generation assay with a functional sensitivity of <=0.01 uIU/mL. Performed at [facility], [address] [place], [place] [address]   Magnesium     Status: None   Collection Time: 05/13/21  6:46 PM  Result Value Ref Range   Magnesium 2.0 1.7 - 2.4 mg/dL    Comment: Performed at [facility], [address] [place], [place] [address]  Phosphorus     Status: None   Collection Time: 05/13/21  6:46 PM  Result Value Ref Range   Phosphorus 4.5 2.5 - 4.6 mg/dL    Comment: Performed at [facility], [address] [place], [place] [address]  Resp Panel by RT-PCR (Flu A&B, Covid) Nasopharyngeal Swab     Status: None   Collection Time: 05/13/21  7:00 PM   Specimen: Nasopharyngeal Swab; Nasopharyngeal(NP) swabs in vial transport medium   Result Value Ref Range   SARS Coronavirus 2 by RT PCR NEGATIVE NEGATIVE    Comment: (NOTE) SARS-CoV-2 target nucleic acids are NOT DETECTED.  The SARS-CoV-2 RNA is generally detectable in upper respiratory specimens during the acute phase of infection. The lowest concentration of SARS-CoV-2 viral copies this assay can detect is 138 copies/mL. A negative result does not preclude SARS-Cov-2 infection and should not be used as the sole basis for treatment or other patient management decisions. A negative result may occur with  improper specimen collection/handling, submission of specimen other than nasopharyngeal swab, presence of viral mutation(s) within the areas targeted by this assay, and inadequate number of viral copies(<138 copies/mL). A negative result must be combined with clinical observations, patient history, and epidemiological information. The expected result is Negative.  Fact Sheet for Patients:  BloggerCourse.com  Fact Sheet for Healthcare Providers:  SeriousBroker.it  This test is no t yet approved or cleared by the [place] FDA and  has been authorized for detection and/or diagnosis of SARS-CoV-2 by FDA under an Emergency Use Authorization (EUA). This EUA will remain  in effect (meaning this test can be used) for the duration of the COVID-19 declaration under Section 564(b)(1) of the Act, 21 U.S.C.section 360bbb-3(b)(1), unless the authorization is terminated  or revoked sooner.       Influenza A by PCR NEGATIVE NEGATIVE   Influenza B by PCR NEGATIVE NEGATIVE    Comment: (NOTE) The Xpert Xpress SARS-CoV-2/FLU/RSV plus assay is intended as an aid in the diagnosis of influenza from Nasopharyngeal swab specimens and should not be used as a sole basis for treatment. Nasal washings and aspirates are unacceptable for Xpert Xpress SARS-CoV-2/FLU/RSV testing.  Fact Sheet for  Patients: BloggerCourse.com  Fact Sheet for Healthcare Providers: SeriousBroker.it  This test is not yet approved or cleared by the [place] FDA and has been authorized for detection and/or diagnosis of SARS-CoV-2 by FDA under an Emergency Use Authorization (EUA). This EUA will remain in effect (meaning this test can be used) for the duration of the COVID-19 declaration under Section 564(b)(1) of the Act, 21 U.S.C. section 360bbb-3(b)(1), unless the authorization is terminated or revoked.  Performed at [facility], [address] [place], [place] [address]   Glucose, capillary     Status: Abnormal   Collection Time: 05/13/21  8:02 PM  Result Value Ref Range  Glucose-Capillary 65 (L) 70 - 99 mg/dL    Comment: Glucose reference range applies only to samples taken after fasting for at least 8 hours.  Glucose, capillary     Status: Abnormal   Collection Time: 05/13/21  9:12 PM  Result Value Ref Range   Glucose-Capillary 49 (L) 70 - 99 mg/dL    Comment: Glucose reference range applies only to samples taken after fasting for at least 8 hours.   Comment 1 Notify RN   Glucose, capillary     Status: Abnormal   Collection Time: 05/13/21  9:57 PM  Result Value Ref Range   Glucose-Capillary 236 (H) 70 - 99 mg/dL    Comment: Glucose reference range applies only to samples taken after fasting for at least 8 hours.  Glucose, capillary     Status: Abnormal   Collection Time: 05/13/21 10:18 PM  Result Value Ref Range   Glucose-Capillary 213 (H) 70 - 99 mg/dL    Comment: Glucose reference range applies only to samples taken after fasting for at least 8 hours.  Glucose, capillary     Status: Abnormal   Collection Time: 05/13/21 10:51 PM  Result Value Ref Range   Glucose-Capillary 216 (H) 70 - 99 mg/dL    Comment: Glucose reference range applies only to samples taken after fasting for at least 8 hours.  Glucose, capillary      Status: Abnormal   Collection Time: 05/13/21 11:24 PM  Result Value Ref Range   Glucose-Capillary 192 (H) 70 - 99 mg/dL    Comment: Glucose reference range applies only to samples taken after fasting for at least 8 hours.  Glucose, capillary     Status: Abnormal   Collection Time: 05/14/21 12:31 AM  Result Value Ref Range   Glucose-Capillary 120 (H) 70 - 99 mg/dL    Comment: Glucose reference range applies only to samples taken after fasting for at least 8 hours.  Glucose, capillary     Status: None   Collection Time: 05/14/21  1:32 AM  Result Value Ref Range   Glucose-Capillary 83 70 - 99 mg/dL    Comment: Glucose reference range applies only to samples taken after fasting for at least 8 hours.  Glucose, capillary     Status: Abnormal   Collection Time: 05/14/21  2:40 AM  Result Value Ref Range   Glucose-Capillary 127 (H) 70 - 99 mg/dL    Comment: Glucose reference range applies only to samples taken after fasting for at least 8 hours.  Urinalysis, Routine w reflex microscopic     Status: Abnormal   Collection Time: 05/14/21  2:44 AM  Result Value Ref Range   Color, Urine YELLOW (A) YELLOW   APPearance CLEAR (A) CLEAR   Specific Gravity, Urine 1.010 1.005 - 1.030   pH 6.0 5.0 - 8.0   Glucose, UA >=500 (A) NEGATIVE mg/dL   Hgb urine dipstick NEGATIVE NEGATIVE   Bilirubin Urine NEGATIVE NEGATIVE   Ketones, ur 20 (A) NEGATIVE mg/dL   Protein, ur NEGATIVE NEGATIVE mg/dL   Nitrite NEGATIVE NEGATIVE   Leukocytes,Ua NEGATIVE NEGATIVE   RBC / HPF 0-5 0 - 5 RBC/hpf   WBC, UA 0-5 0 - 5 WBC/hpf   Bacteria, UA RARE (A) NONE SEEN   Squamous Epithelial / LPF 0-5 0 - 5   Mucus PRESENT     Comment: Performed at [facility], [address] [place], [place] [address]  Glucose, capillary     Status: None   Collection Time:  05/14/21  3:51 AM  Result Value Ref Range   Glucose-Capillary 90 70 - 99 mg/dL    Comment: Glucose reference range applies only to samples taken after fasting  for at least 8 hours.  Glucose, capillary     Status: None   Collection Time: 05/14/21  4:55 AM  Result Value Ref Range   Glucose-Capillary 86 70 - 99 mg/dL    Comment: Glucose reference range applies only to samples taken after fasting for at least 8 hours.  Basic metabolic panel     Status: Abnormal   Collection Time: 05/14/21  5:41 AM  Result Value Ref Range   Sodium 134 (L) 135 - 145 mmol/L   Potassium 3.6 3.5 - 5.1 mmol/L   Chloride 105 98 - 111 mmol/L   CO2 22 22 - 32 mmol/L   Glucose, Bld 121 (H) 70 - 99 mg/dL    Comment: Glucose reference range applies only to samples taken after fasting for at least 8 hours.   BUN 7 6 - 20 mg/dL   Creatinine, Ser 4.27 0.44 - 1.00 mg/dL   Calcium 8.2 (L) 8.9 - 10.3 mg/dL   GFR, Estimated >06 >23 mL/min    Comment: (NOTE) Calculated using the CKD-EPI Creatinine Equation (2021)    Anion gap 7 5 - 15    Comment: Performed at [facility], [address] [place], [place] [address]  Glucose, capillary     Status: Abnormal   Collection Time: 05/14/21  5:54 AM  Result Value Ref Range   Glucose-Capillary 119 (H) 70 - 99 mg/dL    Comment: Glucose reference range applies only to samples taken after fasting for at least 8 hours.  Glucose, capillary     Status: Abnormal   Collection Time: 05/14/21  7:13 AM  Result Value Ref Range   Glucose-Capillary 135 (H) 70 - 99 mg/dL    Comment: Glucose reference range applies only to samples taken after fasting for at least 8 hours.  Glucose, capillary     Status: Abnormal   Collection Time: 05/14/21  8:24 AM  Result Value Ref Range   Glucose-Capillary 124 (H) 70 - 99 mg/dL    Comment: Glucose reference range applies only to samples taken after fasting for at least 8 hours.  Glucose, capillary     Status: Abnormal   Collection Time: 05/14/21  9:30 AM  Result Value Ref Range   Glucose-Capillary 130 (H) 70 - 99 mg/dL    Comment: Glucose reference range applies only to samples taken after fasting  for at least 8 hours.  Urinalysis, Routine w reflex microscopic Urine, Clean Catch     Status: Abnormal   Collection Time: 05/14/21  9:42 AM  Result Value Ref Range   Color, Urine YELLOW (A) YELLOW   APPearance CLEAR (A) CLEAR   Specific Gravity, Urine 1.013 1.005 - 1.030   pH 8.0 5.0 - 8.0   Glucose, UA NEGATIVE NEGATIVE mg/dL   Hgb urine dipstick NEGATIVE NEGATIVE   Bilirubin Urine NEGATIVE NEGATIVE   Ketones, ur NEGATIVE NEGATIVE mg/dL   Protein, ur NEGATIVE NEGATIVE mg/dL   Nitrite NEGATIVE NEGATIVE   Leukocytes,Ua NEGATIVE NEGATIVE    Comment: Performed at [facility], [address] [place], [place] [address]  Glucose, capillary     Status: Abnormal   Collection Time: 05/14/21 10:57 AM  Result Value Ref Range   Glucose-Capillary 103 (H) 70 - 99 mg/dL    Comment: Glucose reference range applies only to samples  taken after fasting for at least 8 hours.  Glucose, capillary     Status: Abnormal   Collection Time: 05/14/21 12:02 PM  Result Value Ref Range   Glucose-Capillary 123 (H) 70 - 99 mg/dL    Comment: Glucose reference range applies only to samples taken after fasting for at least 8 hours.  Glucose, capillary     Status: Abnormal   Collection Time: 05/14/21  1:05 PM  Result Value Ref Range   Glucose-Capillary 100 (H) 70 - 99 mg/dL    Comment: Glucose reference range applies only to samples taken after fasting for at least 8 hours.  Glucose, capillary     Status: Abnormal   Collection Time: 05/14/21  2:13 PM  Result Value Ref Range   Glucose-Capillary 112 (H) 70 - 99 mg/dL    Comment: Glucose reference range applies only to samples taken after fasting for at least 8 hours.  Glucose, capillary     Status: Abnormal   Collection Time: 05/14/21  3:23 PM  Result Value Ref Range   Glucose-Capillary 130 (H) 70 - 99 mg/dL    Comment: Glucose reference range applies only to samples taken after fasting for at least 8 hours.  Glucose, capillary     Status: Abnormal    Collection Time: 05/14/21  4:40 PM  Result Value Ref Range   Glucose-Capillary 145 (H) 70 - 99 mg/dL    Comment: Glucose reference range applies only to samples taken after fasting for at least 8 hours.  Glucose, capillary     Status: Abnormal   Collection Time: 05/14/21  8:29 PM  Result Value Ref Range   Glucose-Capillary 151 (H) 70 - 99 mg/dL    Comment: Glucose reference range applies only to samples taken after fasting for at least 8 hours.  Glucose, capillary     Status: Abnormal   Collection Time: 05/14/21 11:32 PM  Result Value Ref Range   Glucose-Capillary 177 (H) 70 - 99 mg/dL    Comment: Glucose reference range applies only to samples taken after fasting for at least 8 hours.  Glucose, capillary     Status: Abnormal   Collection Time: 05/15/21  4:18 AM  Result Value Ref Range   Glucose-Capillary 132 (H) 70 - 99 mg/dL    Comment: Glucose reference range applies only to samples taken after fasting for at least 8 hours.  Basic metabolic panel     Status: Abnormal   Collection Time: 05/15/21  7:00 AM  Result Value Ref Range   Sodium 131 (L) 135 - 145 mmol/L   Potassium 3.4 (L) 3.5 - 5.1 mmol/L   Chloride 106 98 - 111 mmol/L   CO2 21 (L) 22 - 32 mmol/L   Glucose, Bld 114 (H) 70 - 99 mg/dL    Comment: Glucose reference range applies only to samples taken after fasting for at least 8 hours.   BUN <5 (L) 6 - 20 mg/dL   Creatinine, Ser 9.62 0.44 - 1.00 mg/dL   Calcium 8.1 (L) 8.9 - 10.3 mg/dL   GFR, Estimated >22 >97 mL/min    Comment: (NOTE) Calculated using the CKD-EPI Creatinine Equation (2021)    Anion gap 4 (L) 5 - 15    Comment: Performed at [facility], [address] [place], [place] [address]  Glucose, capillary     Status: Abnormal   Collection Time: 05/15/21  7:42 AM  Result Value Ref Range   Glucose-Capillary 113 (H) 70 - 99 mg/dL  Comment: Glucose reference range applies only to samples taken after fasting for at least 8 hours.    No results  found.  Current scheduled medications  famotidine  20 mg Oral Q12H   insulin aspart  0-14 Units Subcutaneous Q4H   insulin detemir  12 Units Subcutaneous BID   metoCLOPramide  10 mg Oral Q6H   Or   metoCLOPramide (REGLAN) injection  10 mg Intravenous Q6H   ondansetron  4 mg Oral Q6H   Or   ondansetron (ZOFRAN) IV  4 mg Intravenous Q6H    I have reviewed the patient's current medications.  ASSESSMENT: Patient Active Problem List   Diagnosis Date Noted   Hyperemesis complicating pregnancy, antepartum 05/14/2021   Hyperemesis 04/23/2021   Hyperemesis gravidarum 04/23/2021   Type 1 diabetes mellitus with hyperglycemia (HCC) 04/22/2021   Supervision of high risk pregnancy in first trimester 04/12/2021   [redacted] weeks gestation of pregnancy    Abnormal findings on antenatal screening    Anxiety 10/18/2015    PLAN: HG- scheduled antiemetics Zofran, Reglan and Pepcid ordered. Pt now taking PO meds without difficulty since this AM. Tolerating clear fluids and very light breakfast. Plan to advance diet as tolerated. Decreased IV fluids LR to 86ml/hr Type 1 DM- Diabetic coordinator paged. Insulin pump remains off for now. Started Levemir last night for basal insulin needs, Pt would like to dc home later today if able to tolerate increasing amounts of PO fluid and food. Reviewed with Diabeetic coord for POC and DC home insulin.  Continue routine antenatal care.   Randa Ngo, CNM 05/15/2021  11:09 AM

## 2021-05-15 NOTE — Progress Notes (Signed)
Inpatient Diabetes Program Recommendations  AACE/ADA: New Consensus Statement on Inpatient Glycemic Control (2015)  Target Ranges:  Prepandial:   less than 140 mg/dL      Peak postprandial:   less than 180 mg/dL (1-2 hours)      Critically ill patients:  140 - 180 mg/dL   Lab Results  Component Value Date   GLUCAP 234 (H) 05/15/2021   HGBA1C 5.9 (H) 04/22/2021    Review of Glycemic Control  Diabetes history: DM1 Outpatient Diabetes medications: T-slim insulin pump Current orders for Inpatient glycemic control: Levemir 12 units BID, Novolog 0-14 units Q4H  Inpatient Diabetes Program Recommendations:    Levemir 15 units BID Novolog 1 unit for every 20 mg/dL > 626 mg/dL TID ac, hs and 2 am Novolog 1 units for every 8 grams of carbohydrate when eating  Levemir Flextouch insulin pen (Order # [id number]) Novolog Flexpen insulin pen (Order [phone]) Insulin pen needles (Order # [id number])  Pt has Dexcom CGM to monitor blood sugars. Pt to call Dr Gershon Crane tomorrow morning for appt to change insulin pump settings as soon as possible.   Spoke with pt on phone today regarding her diabetes control and importance of calling first thing in the am to get appt with Endo. Will need to be seen 12/5 for insulin pump adjustments.  Reviewed hypoglycemia s/s and treatment.  Also reviewed goals of glucose control with pregnancy and why it's important to have excellent glycemic control for healthy baby and self.  Reviewed sick day rules with taking insulin. Pt voices understanding.   Thank you. Ailene Ards, RD, LDN, CDE Inpatient Diabetes Coordinator [phone]

## 2021-05-24 ENCOUNTER — Other Ambulatory Visit: Payer: Self-pay

## 2021-05-24 DIAGNOSIS — Z8759 Personal history of other complications of pregnancy, childbirth and the puerperium: Secondary | ICD-10-CM

## 2021-05-24 DIAGNOSIS — Z98891 History of uterine scar from previous surgery: Secondary | ICD-10-CM

## 2021-05-24 DIAGNOSIS — O0991 Supervision of high risk pregnancy, unspecified, first trimester: Secondary | ICD-10-CM

## 2021-05-24 DIAGNOSIS — O09299 Supervision of pregnancy with other poor reproductive or obstetric history, unspecified trimester: Secondary | ICD-10-CM

## 2021-05-24 DIAGNOSIS — O24011 Pre-existing diabetes mellitus, type 1, in pregnancy, first trimester: Secondary | ICD-10-CM

## 2021-05-24 DIAGNOSIS — O289 Unspecified abnormal findings on antenatal screening of mother: Secondary | ICD-10-CM

## 2021-05-26 ENCOUNTER — Ambulatory Visit: Payer: Medicaid Other | Attending: Obstetrics and Gynecology

## 2021-05-26 ENCOUNTER — Other Ambulatory Visit: Payer: Self-pay

## 2021-05-26 ENCOUNTER — Ambulatory Visit (HOSPITAL_BASED_OUTPATIENT_CLINIC_OR_DEPARTMENT_OTHER): Payer: Medicaid Other | Admitting: Obstetrics and Gynecology

## 2021-05-26 DIAGNOSIS — O09291 Supervision of pregnancy with other poor reproductive or obstetric history, first trimester: Secondary | ICD-10-CM

## 2021-05-26 DIAGNOSIS — O24011 Pre-existing diabetes mellitus, type 1, in pregnancy, first trimester: Secondary | ICD-10-CM | POA: Insufficient documentation

## 2021-05-26 DIAGNOSIS — Z3A13 13 weeks gestation of pregnancy: Secondary | ICD-10-CM | POA: Insufficient documentation

## 2021-05-26 DIAGNOSIS — Z79899 Other long term (current) drug therapy: Secondary | ICD-10-CM | POA: Insufficient documentation

## 2021-05-26 DIAGNOSIS — O24111 Pre-existing diabetes mellitus, type 2, in pregnancy, first trimester: Secondary | ICD-10-CM

## 2021-05-26 DIAGNOSIS — O09299 Supervision of pregnancy with other poor reproductive or obstetric history, unspecified trimester: Secondary | ICD-10-CM

## 2021-05-26 DIAGNOSIS — O289 Unspecified abnormal findings on antenatal screening of mother: Secondary | ICD-10-CM | POA: Diagnosis not present

## 2021-05-26 DIAGNOSIS — Z794 Long term (current) use of insulin: Secondary | ICD-10-CM | POA: Insufficient documentation

## 2021-05-26 DIAGNOSIS — O34219 Maternal care for unspecified type scar from previous cesarean delivery: Secondary | ICD-10-CM

## 2021-05-26 DIAGNOSIS — Z9641 Presence of insulin pump (external) (internal): Secondary | ICD-10-CM | POA: Diagnosis not present

## 2021-05-26 DIAGNOSIS — O0991 Supervision of high risk pregnancy, unspecified, first trimester: Secondary | ICD-10-CM | POA: Insufficient documentation

## 2021-05-26 DIAGNOSIS — N858 Other specified noninflammatory disorders of uterus: Secondary | ICD-10-CM | POA: Diagnosis not present

## 2021-05-26 DIAGNOSIS — Z8759 Personal history of other complications of pregnancy, childbirth and the puerperium: Secondary | ICD-10-CM

## 2021-05-26 DIAGNOSIS — Z363 Encounter for antenatal screening for malformations: Secondary | ICD-10-CM | POA: Insufficient documentation

## 2021-05-26 DIAGNOSIS — Z98891 History of uterine scar from previous surgery: Secondary | ICD-10-CM

## 2021-05-26 DIAGNOSIS — E119 Type 2 diabetes mellitus without complications: Secondary | ICD-10-CM | POA: Diagnosis not present

## 2021-05-26 NOTE — Progress Notes (Signed)
Maternal-Fetal Medicine   Name: Cheryl Avila DOB: 1996-04-01 MRN: [id number] Referring Provider: Haroldine Laws, CNM   I had the pleasure of seeing Ms. Kram today at [facility].  She is G3 P0202 at 13w 6d gestation and is here for first-trimester ultrasound and consultation. Her problems include: -Type 1 diabetes diagnosed in 2011 and the patient is being followed by her endocrinologist, Dr. Verdis Frederickson.  She was seen by him on 05/19/2021 and insulin pump adjustments were made.  Patient is checking her blood glucose and reports her fasting levels between 100-110 milligrams/dL, and postprandial levels are in the 130s.  She had ophthalmology examination in April 2022 and there is no evidence of proliferative retinopathy.  Patient does not have neuropathies and her serum creatinine is within normal range. Patient had diabetic ketoacidosis in her second pregnancy. Her most recent hemoglobin A1c is 5.9% (04/22/2021).  Her TSH levels are within normal range. -History of 2 cesarean deliveries.  I reviewed the operative note and she had low transverse cesarean deliveries in both pregnancies. -Previous child with encephalocele. -Hyperemesis gravidarum.  Patient had admissions for hyperemesis that has resolved.  She feels well now takes Zofran and Diclegis as needed. She does not have hypertension or any other chronic medical conditions.  Obstetric history -07/2016: Preterm cesarean delivery at 34 weeks 6 days gestation of a female infant.  Her daughter had occipital encephalocele that was diagnosed prenatally.  Before delivery, fetal hydrops was detected.  She had surgery and, has VP shunt now.  According to our patient, she has good neurodevelopmental outcome. -06/2018: Preterm cesarean delivery because of nonreassuring fetal heart rates (recurrent late decelerations) of a female infant weighing 4,295 grams at birth.  No evidence of abruption. GYN history: No history of abnormal Pap  smears or cervical surgeries.  No history of breast disease.  Her previous menstrual cycles were reportedly regular. Medications: Prenatal vitamins, low-dose aspirin, Zofran, Diclegis, folic acid. Allergies: Naproxen (hives). Prenatal course: Patient had cell free fetal DNA screening yesterday.  Ultrasound Single intrauterine pregnancy.  The CRL measurement is consistent with the previously established dates.  The calvarium appears normal.  Fetal anatomical survey is very limited because of maternal body habitus.  Placenta is anterior and there is no evidence of previa or placenta accreta spectrum. I have recommended transvaginal ultrasound to evaluate the intracranial structures because of history of encephalocele in the first child.  Patient had opted not to have transvaginal ultrasound today. Type 1 diabetes Patient is being closely followed by her endocrinologist. She is educated on insulin pump management and understands the normal parameters of fasting and postprandial levels in pregnancy.  I discussed the importance of good control of diabetes to prevent adverse fetal and neonatal outcomes. Miscarriages are increased. The likelihood of congenital malformations with a periconception hemoglobin A1C of 5.9% is very low. However, fetal congenital malformations are slightly increased even if diabetes is well controlled. Other complications include fetal macrosomia leading shoulder dystocia or neurological injuries at birth (Erb's palsy). Shoulder dystocia can also complicate cesarean deliveries. Neonatal complications including respiratory-distress syndrome, hypoglycemia can also occur. In poorly-controlled diabetes, the likelihood of stillbirth is increased.  Diet and exercise decrease insulin requirements. I discussed ultrasound protocol. Serial fetal growth assessments and weekly antenatal testing beginning at 32 weeks till delivery will be performed. Delivery is recommended at 39 weeks if  diabetes is well controlled. Early term delivery (37 to 39 weeks) may be recommended if poor control is seen. I discussed the benefit  of low-dose aspirin in reducing the likelihood of developing preeclampsia. I encouraged her to continue low-dose aspirin till delivery. Patient does not have contraindications to aspirin.  Previous cesarean deliveries Her prenatal records in her second pregnancy mentioned previous classical cesarean delivery.  I reviewed the operative note of her first cesarean delivery performed on 07/26/2016 and I note that she had a low transverse cesarean section and NOT classical cesarean delivery (Op note: The bladder blade was then placed to expose the lower anterior uterine segment. The uterine incision was made with a scalpel in a low transverse manner.).  Patient would like to have repeat cesarean delivery.  I explained that she did not have classical cesarean delivery in her first pregnancy.  If diabetes is well controlled, she can have repeat cesarean delivery at [redacted] weeks gestation. Repeat cesarean deliveries increase the risk of placenta previa and/or or placenta accreta spectrum.  Previous child with encephalocele In the absence of genetic syndrome, the recurrence rate is 2% to 5%.  Preconception folic acid of 5 mg reduces the likelihood of recurrent neural tube defects.  However, increase in dosage of folic acid now is not helpful as the neural tube closure occurs very early in pregnancy.  Ultrasound should be able to detect encephalocele and open spina bifida.   Recommendations -An appointment was made for her to return in 6 weeks for detailed fetal anatomical survey. -MSAFP screening after 15 weeks. -We will set up an appointment for fetal echocardiography at her next visit. -Fetal growth assessments every 4 weeks till delivery. -Weekly BPP from [redacted] weeks gestation till delivery. -Delivery at [redacted] weeks gestation provider diabetes is well controlled.  Early term  delivery (37 to 39 weeks) if diabetes not well controlled. -Continue follow-up with her endocrinologist. Thank you for consultation.  If you have any questions or concerns, please contact me the [facility].  Consultation including face-to-face counseling (more than 50% of time spent) is 45 minutes.

## 2021-06-30 ENCOUNTER — Other Ambulatory Visit: Payer: Self-pay

## 2021-06-30 DIAGNOSIS — O24011 Pre-existing diabetes mellitus, type 1, in pregnancy, first trimester: Secondary | ICD-10-CM

## 2021-06-30 DIAGNOSIS — R1110 Vomiting, unspecified: Secondary | ICD-10-CM

## 2021-06-30 DIAGNOSIS — F419 Anxiety disorder, unspecified: Secondary | ICD-10-CM

## 2021-06-30 DIAGNOSIS — O34219 Maternal care for unspecified type scar from previous cesarean delivery: Secondary | ICD-10-CM

## 2021-06-30 DIAGNOSIS — Z98891 History of uterine scar from previous surgery: Secondary | ICD-10-CM

## 2021-07-07 ENCOUNTER — Other Ambulatory Visit: Payer: Medicaid Other

## 2022-02-07 IMAGING — US US PELVIS COMPLETE
1 series · 14 of 25 positions shown · non-contrast
Comparison: None.

CLINICAL DATA: Lower abdominal pain

EXAM:
TRANSABDOMINAL ULTRASOUND OF PELVIS
DOPPLER ULTRASOUND OF OVARIES
TECHNIQUE: Transabdominal ultrasound examination of the pelvis was performed
including evaluation of the uterus, ovaries, adnexal regions, and
pelvic cul-de-sac.
Color and duplex Doppler ultrasound was utilized to evaluate blood
flow to the ovaries.

[Series 1: us pelvis (transabdominal only) · 14 of 39 slices shown]
[im 1/39]
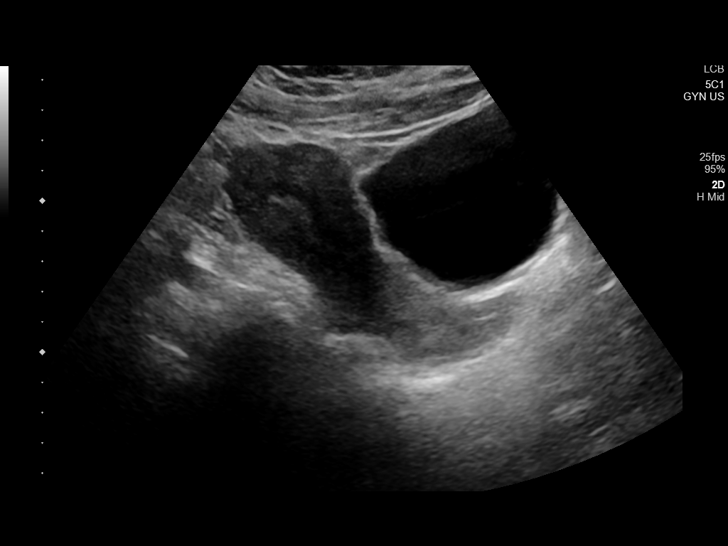
[im 4/39]
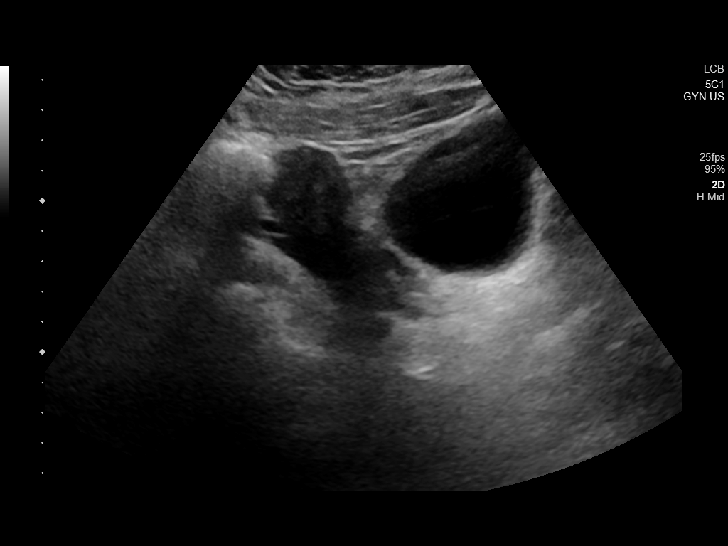
[im 7/39]
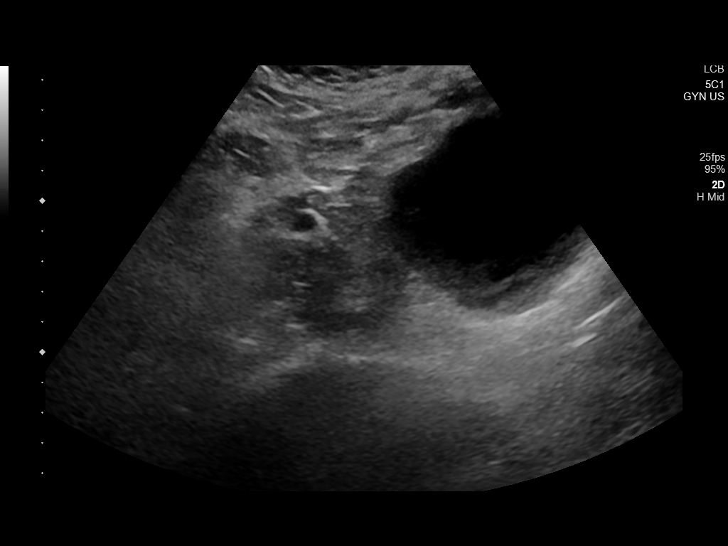
[im 10/39]
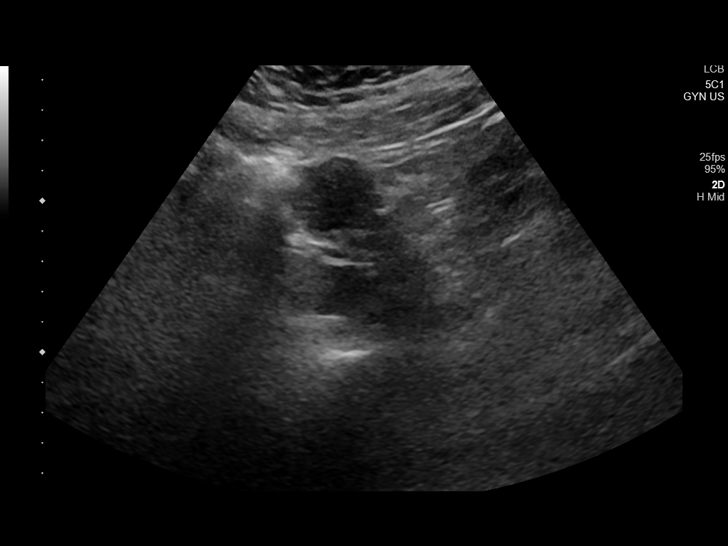
[im 13/39]
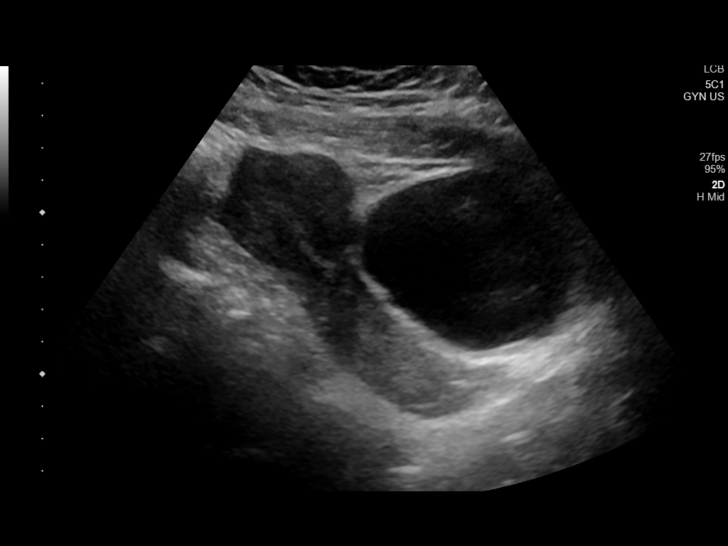
[im 15/39]
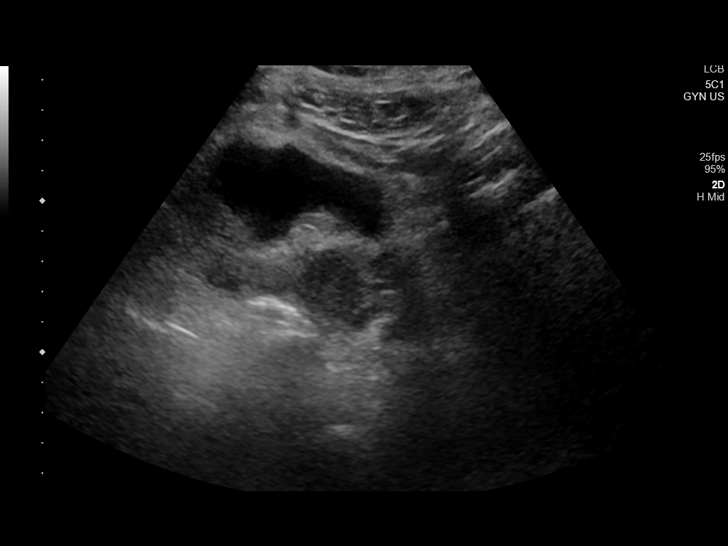
[im 18/39]
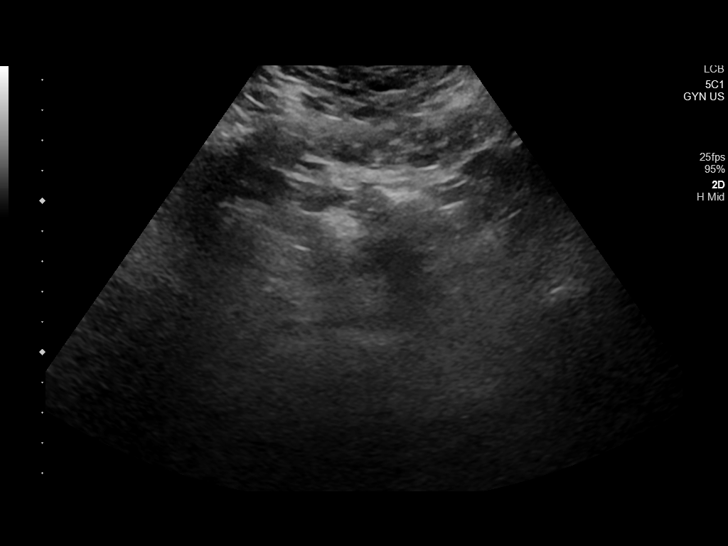
[im 21/39]
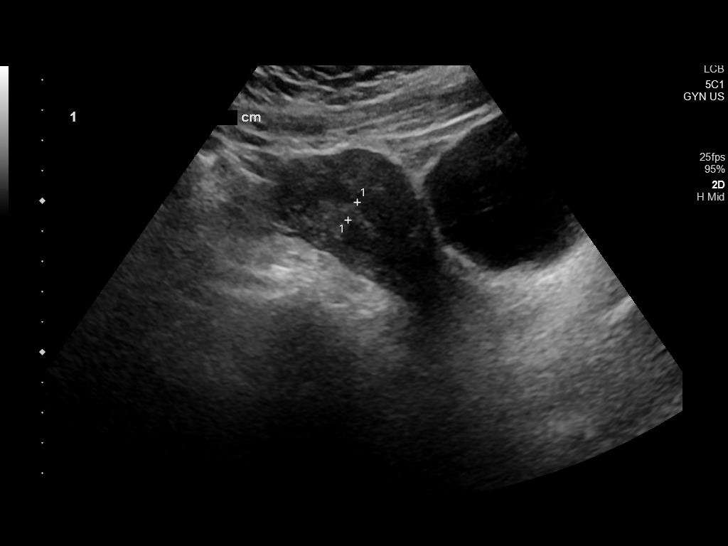
[im 24/39]
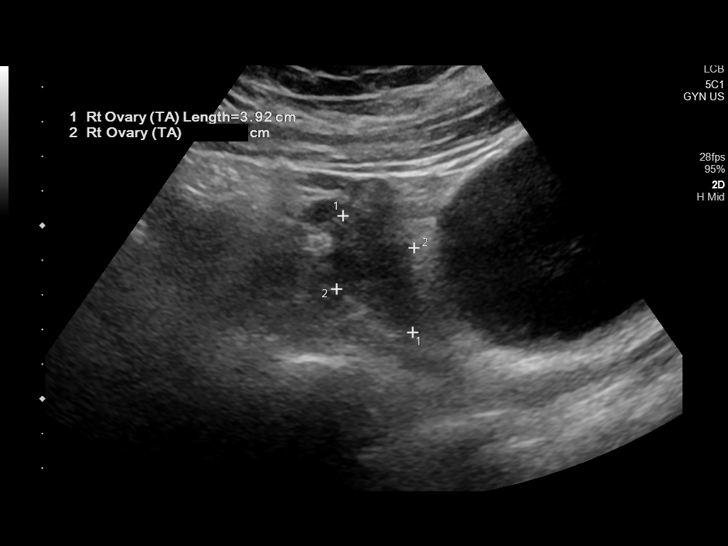
[im 26/39]
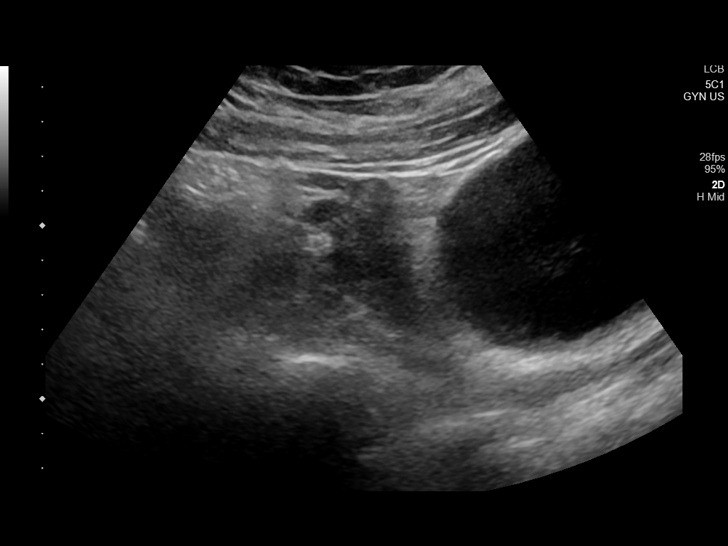
[im 29/39]
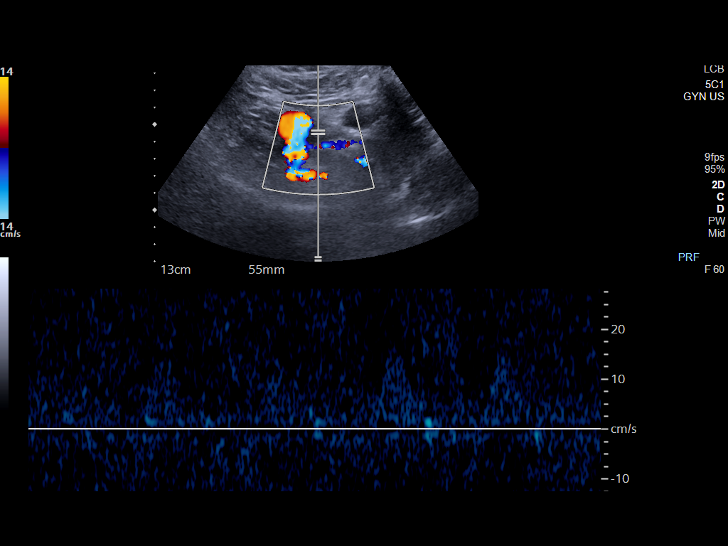
[im 32/39]
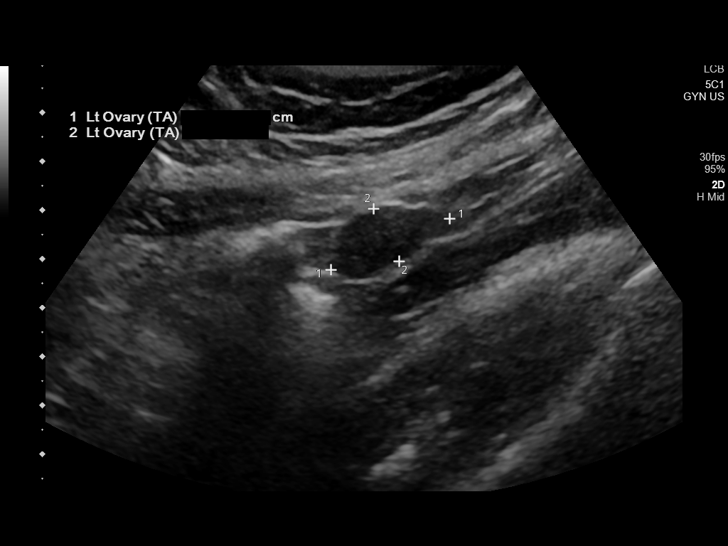
[im 35/39]
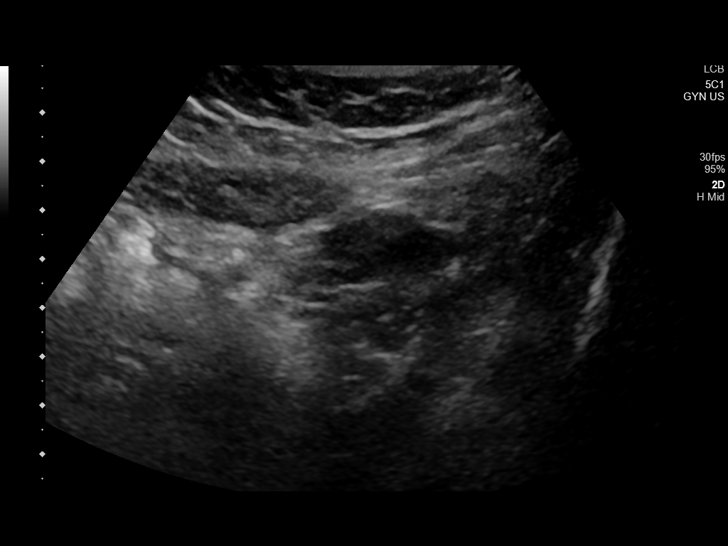
[im 39/39]
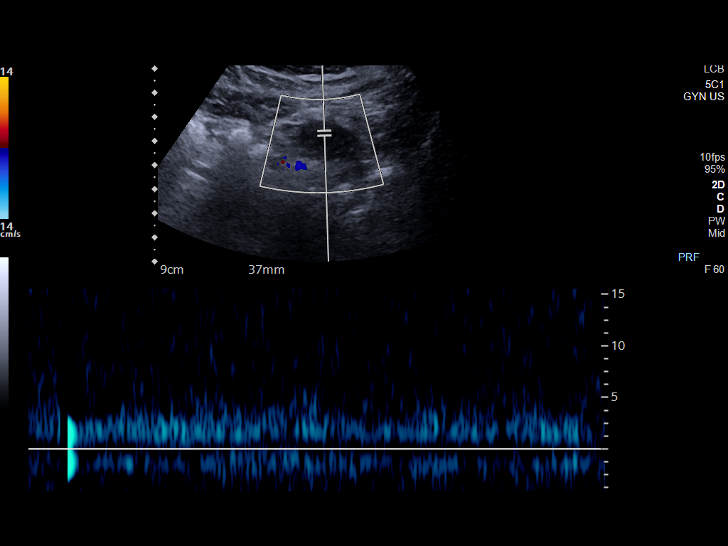

[14 of 25 positions shown; findings below may reference images not displayed]

FINDINGS: Uterus

Measurements: 10.3 x 4.2 x 4.7 cm = volume: 108 mL. No fibroids or
other mass visualized.

Endometrium

Thickness: 6.6 mm.  No focal abnormality visualized.

Right ovary

Measurements: 3.9 x 2.5 x 2.7 cm = volume: 14 mL. Normal
appearance/no adnexal mass.

Left ovary

Measurements: 2.6 x 1.2 x 2.8 cm = volume: 5 mL. Normal
appearance/no adnexal mass.

Pulsed Doppler evaluation demonstrates normal low-resistance
arterial and venous waveforms in both ovaries.

Other: No free fluid
IMPRESSION: Normal pelvic ultrasound and ovarian Doppler

## 2022-03-16 ENCOUNTER — Other Ambulatory Visit: Payer: Self-pay

## 2022-03-16 ENCOUNTER — Emergency Department
Admission: EM | Admit: 2022-03-16 | Discharge: 2022-03-16 | Disposition: A | Discharge status: Home / Self Care | Payer: Medicaid Other | Attending: Emergency Medicine | Admitting: Emergency Medicine

## 2022-03-16 DIAGNOSIS — E11649 Type 2 diabetes mellitus with hypoglycemia without coma: Secondary | ICD-10-CM | POA: Insufficient documentation

## 2022-03-16 DIAGNOSIS — Z7401 Bed confinement status: Secondary | ICD-10-CM | POA: Insufficient documentation

## 2022-03-16 DIAGNOSIS — R21 Rash and other nonspecific skin eruption: Secondary | ICD-10-CM | POA: Insufficient documentation

## 2022-03-16 DIAGNOSIS — Z794 Long term (current) use of insulin: Secondary | ICD-10-CM | POA: Diagnosis not present

## 2022-03-16 DIAGNOSIS — R7989 Other specified abnormal findings of blood chemistry: Secondary | ICD-10-CM

## 2022-03-16 DIAGNOSIS — R112 Nausea with vomiting, unspecified: Secondary | ICD-10-CM | POA: Diagnosis present

## 2022-03-16 DIAGNOSIS — R945 Abnormal results of liver function studies: Secondary | ICD-10-CM | POA: Diagnosis not present

## 2022-03-16 DIAGNOSIS — E162 Hypoglycemia, unspecified: Secondary | ICD-10-CM

## 2022-03-16 DIAGNOSIS — R251 Tremor, unspecified: Secondary | ICD-10-CM | POA: Diagnosis not present

## 2022-03-16 LAB — COMPREHENSIVE METABOLIC PANEL
ALT: 121 U/L — ABNORMAL HIGH (ref 0–44)
AST: 126 U/L — ABNORMAL HIGH (ref 15–41)
Albumin: 4 g/dL (ref 3.5–5.0)
Alkaline Phosphatase: 157 U/L — ABNORMAL HIGH (ref 38–126)
Anion gap: 8 (ref 5–15)
BUN: 17 mg/dL (ref 6–20)
CO2: 24 mmol/L (ref 22–32)
Calcium: 9.3 mg/dL (ref 8.9–10.3)
Chloride: 105 mmol/L (ref 98–111)
Creatinine, Ser: 0.64 mg/dL (ref 0.44–1.00)
GFR, Estimated: >60 mL/min (ref >60)
Glucose, Bld: 185 mg/dL — ABNORMAL HIGH (ref 70–99)
Potassium: 4.3 mmol/L (ref 3.5–5.1)
Sodium: 137 mmol/L (ref 135–145)
Total Bilirubin: 0.9 mg/dL (ref 0.3–1.2)
Total Protein: 7.9 g/dL (ref 6.5–8.1)

## 2022-03-16 LAB — CBC WITH DIFFERENTIAL/PLATELET
Abs Immature Granulocytes: 0.03 10*3/uL (ref 0.00–0.07)
Basophils Absolute: 0 10*3/uL (ref 0.0–0.1)
Basophils Relative: 0 %
Eosinophils Absolute: 0 10*3/uL (ref 0.0–0.5)
Eosinophils Relative: 0 %
HCT: 42.9 % (ref 36.0–46.0)
Hemoglobin: 14.7 g/dL (ref 12.0–15.0)
Immature Granulocytes: 0 %
Lymphocytes Relative: 12 %
Lymphs Abs: 1.1 10*3/uL (ref 0.7–4.0)
MCH: 29.3 pg (ref 26.0–34.0)
MCHC: 34.3 g/dL (ref 30.0–36.0)
MCV: 85.5 fL (ref 80.0–100.0)
Monocytes Absolute: 0.3 10*3/uL (ref 0.1–1.0)
Monocytes Relative: 3 %
Neutro Abs: 7.2 10*3/uL (ref 1.7–7.7)
Neutrophils Relative %: 85 %
Platelets: 382 10*3/uL (ref 150–400)
RBC: 5.02 MIL/uL (ref 3.87–5.11)
RDW: 12.7 % (ref 11.5–15.5)
WBC: 8.6 10*3/uL (ref 4.0–10.5)
nRBC: 0 % (ref 0.0–0.2)

## 2022-03-16 LAB — CBG MONITORING, ED: Glucose-Capillary: 164 mg/dL — ABNORMAL HIGH (ref 70–99)

## 2022-03-16 MED ORDER — ONDANSETRON 4 MG PO TBDP: 4.0000 mg | ORAL_TABLET | Freq: Three times a day (TID) | ORAL | 0 refills | Status: DC | PRN

## 2022-03-16 NOTE — ED Provider Notes (Signed)
[facility] Provider Note    Event Date/Time   First MD Initiated Contact with Patient 03/16/22 1307     (approximate)   History   Chief Complaint Hypoglycemia (Pt. To ED from home for swinging blood sugars since 0300. Blood sugar was 36 this AM, last blood sugar pta was 170. Pt. States she has had tremors all morning. And has been vomiting. Pt. States she is breastfeeding since may and blood sugars have been inconsistent since then. Pt. Also c/o bites or bumps to back x2 days that she thinks may be a reaction to hot tub. Glucose in triage 164.)   HPI  Cheryl Avila is a 26 y.o. female with past medical history of diabetes who presents to the ED complaining of hyperglycemia.  Patient reports that her her blood sugar has been seeming to fluctuate rapidly since about 3:00 this morning.  She states that she checked it then and found it to be as low as 36, but was able to eat something and on recheck found it to be 170.  She has felt nauseous this morning with a couple episodes of vomiting as well as tremors.  She does state that her blood sugars have been inconsistent since she had a baby in May and has been breast-feeding.  She additionally complains of painful rash across her back for the past 2 days, which she states came on after she spent time in a hot tub.  She now states that she is feeling better and is requesting to leave.  She reports taking Lantus and sliding scale insulin for the past 13 years with no recent change in her regimen.     Physical Exam   Triage Vital Signs: ED Triage Vitals  Enc Vitals Group     BP 03/16/22 1223 127/79     Pulse Rate 03/16/22 1223 (!) 108     Resp 03/16/22 1223 18     Temp 03/16/22 1228 98.6 F (37 C)     Temp Source 03/16/22 1223 Oral     SpO2 03/16/22 1223 99 %     Weight 03/16/22 1224 210 lb (95.3 kg)     Height 03/16/22 1224 5' (1.524 m)     Head Circumference --      Peak Flow --      Pain Score 03/16/22  1224 5     Pain Loc --      Pain Edu? --      Excl. in [place]? --     Most recent vital signs: Vitals:   03/16/22 1223 03/16/22 1228  BP: 127/79   Pulse: (!) 108   Resp: 18   Temp:  98.6 F (37 C)  SpO2: 99%     Constitutional: Alert and oriented. Eyes: Conjunctivae are normal. Head: Atraumatic. Nose: No congestion/rhinnorhea. Mouth/Throat: Mucous membranes are moist.  Cardiovascular: Normal rate, regular rhythm. Grossly normal heart sounds.  2+ radial pulses bilaterally. Respiratory: Normal respiratory effort.  No retractions. Lungs CTAB. Gastrointestinal: Soft and nontender. No distention. Musculoskeletal: No lower extremity tenderness nor edema.  Numerous small pustules over back with no contiguous erythema or induration. Neurologic:  Normal speech and language. No gross focal neurologic deficits are appreciated.    ED Results / Procedures / Treatments   Labs (all labs ordered are listed, but only abnormal results are displayed) Labs Reviewed  COMPREHENSIVE METABOLIC PANEL - Abnormal; Notable for the following components:      Result Value   Glucose, Bld  185 (*)    AST 126 (*)    ALT 121 (*)    Alkaline Phosphatase 157 (*)    All other components within normal limits  CBG MONITORING, ED - Abnormal; Notable for the following components:   Glucose-Capillary 164 (*)    All other components within normal limits  CBC WITH DIFFERENTIAL/PLATELET    PROCEDURES:  Critical Care performed: No  Procedures   MEDICATIONS ORDERED IN ED: Medications - No data to display   IMPRESSION / MDM / [place] / ED COURSE  I reviewed the triage vital signs and the nursing notes.                              26 y.o. female with past medical history of diabetes who presents to the ED complaining of fluctuating blood glucose this morning associated with nausea, vomiting, and rash.  Patient's presentation is most consistent with acute presentation with potential threat to  life or bodily function.  Differential diagnosis includes, but is not limited to, pregnancy, hypoglycemia, electrolyte abnormality, AKI, UTI, biliary colic, cholecystitis, pancreatitis, hepatitis, and pregnancy.  Patient nontoxic-appearing and in no acute distress, vital signs remarkable for mild tachycardia but otherwise reassuring.  Labs show stable blood glucose with no other significant electrolyte abnormality or AKI.  No significant leukocytosis or anemia noted.  Patient does have mild elevation in her AST and ALT as well as her alkaline phosphatase, with normal bilirubin.  She states that her nausea has resolved and has no abdominal tenderness on exam.  She would benefit from right upper quadrant ultrasound as well as pregnancy testing and urinalysis, however patient adamantly refuses and wishes to be discharged home, states she is feeling better.  She was counseled to carefully monitor her blood glucose at home and follow-up with her PCP for recheck of LFTs and potential right upper quadrant ultrasound.  She will be prescribed Zofran for use as needed, was counseled to return to the ED for new or worsening symptoms, patient agrees with plan.      FINAL CLINICAL IMPRESSION(S) / ED DIAGNOSES   Final diagnoses:  Hypoglycemia  Nausea and vomiting, unspecified vomiting type  Abnormal LFTs     Rx / DC Orders   ED Discharge Orders          Ordered    ondansetron (ZOFRAN-ODT) 4 MG disintegrating tablet  Every 8 hours PRN        03/16/22 1309             Note:  This document was prepared using Dragon voice recognition software and may include unintentional dictation errors.   Blake Divine, MD 03/16/22 1324

## 2022-03-16 NOTE — ED Triage Notes (Signed)
Pt. To ED from home for swinging blood sugars since 0300. Blood sugar was 36 this AM, last blood sugar pta was 170. Pt. States she has had tremors all morning. And has been vomiting. Pt. States she is breastfeeding since may and blood sugars have been inconsistent since then. Pt. Also c/o bites or bumps to back x2 days that she thinks may be a reaction to hot tub. Glucose in triage 164.

## 2022-08-17 ENCOUNTER — Emergency Department: Payer: Medicaid Other

## 2022-08-17 ENCOUNTER — Other Ambulatory Visit: Payer: Self-pay

## 2022-08-17 ENCOUNTER — Emergency Department
Admission: EM | Admit: 2022-08-17 | Discharge: 2022-08-17 | Disposition: A | Discharge status: Home / Self Care | Payer: Medicaid Other | Attending: Emergency Medicine | Admitting: Emergency Medicine

## 2022-08-17 DIAGNOSIS — Z794 Long term (current) use of insulin: Secondary | ICD-10-CM | POA: Insufficient documentation

## 2022-08-17 DIAGNOSIS — N939 Abnormal uterine and vaginal bleeding, unspecified: Secondary | ICD-10-CM | POA: Insufficient documentation

## 2022-08-17 DIAGNOSIS — R109 Unspecified abdominal pain: Secondary | ICD-10-CM | POA: Diagnosis not present

## 2022-08-17 DIAGNOSIS — E109 Type 1 diabetes mellitus without complications: Secondary | ICD-10-CM | POA: Insufficient documentation

## 2022-08-17 LAB — CBC
HCT: 42.9 % (ref 36.0–46.0)
Hemoglobin: 14.9 g/dL (ref 12.0–15.0)
MCH: 31.3 pg (ref 26.0–34.0)
MCHC: 34.7 g/dL (ref 30.0–36.0)
MCV: 90.1 fL (ref 80.0–100.0)
Platelets: 286 10*3/uL (ref 150–400)
RBC: 4.76 MIL/uL (ref 3.87–5.11)
RDW: 12.2 % (ref 11.5–15.5)
WBC: 6.6 10*3/uL (ref 4.0–10.5)
nRBC: 0 % (ref 0.0–0.2)

## 2022-08-17 LAB — URINALYSIS, ROUTINE W REFLEX MICROSCOPIC
Bacteria, UA: NONE SEEN
Bilirubin Urine: NEGATIVE
Glucose, UA: >=500 mg/dL — AB
Ketones, ur: 20 mg/dL — AB
Leukocytes,Ua: NEGATIVE
Nitrite: NEGATIVE
Protein, ur: NEGATIVE mg/dL
RBC / HPF: >50 RBC/hpf (ref 0–5)
Specific Gravity, Urine: 1.036 — ABNORMAL HIGH (ref 1.005–1.030)
pH: 5 (ref 5.0–8.0)

## 2022-08-17 LAB — COMPREHENSIVE METABOLIC PANEL
ALT: 17 U/L (ref 0–44)
AST: 12 U/L — ABNORMAL LOW (ref 15–41)
Albumin: 3.8 g/dL (ref 3.5–5.0)
Alkaline Phosphatase: 102 U/L (ref 38–126)
Anion gap: 8 (ref 5–15)
BUN: 14 mg/dL (ref 6–20)
CO2: 23 mmol/L (ref 22–32)
Calcium: 9 mg/dL (ref 8.9–10.3)
Chloride: 103 mmol/L (ref 98–111)
Creatinine, Ser: 0.77 mg/dL (ref 0.44–1.00)
GFR, Estimated: >60 mL/min (ref >60)
Glucose, Bld: 384 mg/dL — ABNORMAL HIGH (ref 70–99)
Potassium: 4.4 mmol/L (ref 3.5–5.1)
Sodium: 134 mmol/L — ABNORMAL LOW (ref 135–145)
Total Bilirubin: 0.7 mg/dL (ref 0.3–1.2)
Total Protein: 7.1 g/dL (ref 6.5–8.1)

## 2022-08-17 LAB — POC URINE PREG, ED: Preg Test, Ur: NEGATIVE

## 2022-08-17 LAB — HCG, QUANTITATIVE, PREGNANCY: hCG, Beta Chain, Quant, S: <1 m[IU]/mL (ref <5)

## 2022-08-17 MED ORDER — SODIUM CHLORIDE 0.9 % IV BOLUS
1000.0000 mL | Freq: Once | INTRAVENOUS | Status: AC
  Administered 2022-08-17: 1000 mL via INTRAVENOUS

## 2022-08-17 MED ORDER — IBUPROFEN 600 MG PO TABS
600.0000 mg | ORAL_TABLET | Freq: Once | ORAL | Status: AC
  Administered 2022-08-17: 600 mg via ORAL
  Filled 2022-08-17: qty 1

## 2022-08-17 MED ORDER — INSULIN ASPART 100 UNIT/ML IJ SOLN
5.0000 [IU] | Freq: Once | INTRAMUSCULAR | Status: AC
  Administered 2022-08-17: 5 [IU] via INTRAVENOUS
  Filled 2022-08-17: qty 1

## 2022-08-17 NOTE — ED Notes (Signed)
RN at bedside to assess pt. Pt on bedside commode upon entering room. Pt did not provide urine sample at this time. Pt provided pads and mesh underwear due to bleeding.

## 2022-08-17 NOTE — ED Triage Notes (Addendum)
Patient reports she was a week late with her menstrual cycle. This AM vaginal bleeding onset with associated n/v and lightheadness. Reports bleeding more than typical for her but denies passage of clots. Reports blood is a mixture of dark red and bright. Reports bleeding onset 1 hr ago. Pt ambulatory to triage. Breathing unlabored speaking in full sentences. Pt denies being on birth control but states hx of tubal ligation.   Pt now reports that bleeding onset after sexual intercourse this morning. Denies rougher activity than typical or use of any/new toys or devices.

## 2022-08-17 NOTE — ED Notes (Signed)
Pt transported to US

## 2022-08-17 NOTE — ED Provider Notes (Signed)
[facility] Provider Note    Event Date/Time   First MD Initiated Contact with Patient 08/17/22 [phone]     (approximate)   History   Vaginal Bleeding   HPI  Cheryl Avila is a 27 y.o. female   Past medical history of type 1 diabetes who presents to the emergency department with menstrual cramping and vaginal bleeding.  She is 2 weeks late for her expected period, had intercourse last night which was uneventful but began to bleed afterwards.  Alarmed by the bleeding, she got up to go to the bathroom when she felt lightheaded and had to sit on the floor after which his symptoms resolved.  She has some bilateral lower quadrant cramping afterwards.  She was in her otherwise regular state of health with no other acute illnesses or trauma.  She has no GU or GI complaints other than the cramping pain.  No discharge from the vagina.  She forgot to take her insulin this morning.  Intercourse last night was uneventful, no rough, no toys, no trauma noted by patient  [place] contributed to assessment above: Husband is at bedside  External Medical Documents Reviewed: Emergency department visit dated October 2023 for hypoglycemia      Physical Exam   Triage Vital Signs: ED Triage Vitals [08/17/22 0530]  Enc Vitals Group     BP 114/78     Pulse Rate 94     Resp 18     Temp 98.6 F (37 C)     Temp Source Oral     SpO2 94 %     Weight 200 lb (90.7 kg)     Height 5' (1.524 m)     Head Circumference      Peak Flow      Pain Score 7     Pain Loc      Pain Edu?      Excl. in [place]?     Most recent vital signs: Vitals:   08/17/22 0530  BP: 114/78  Pulse: 94  Resp: 18  Temp: 98.6 F (37 C)  SpO2: 94%    General: Awake, no distress.  CV:  Good peripheral perfusion.  Resp:  Normal effort.  Abd:  No distention.  Other:  Awake comfortable normal vital signs mild pain to palpation bilateral lower quadrants and a pelvic exam that shows  no obvious trauma and there is some scant blood in the vaginal vault.  No discharge.  No cervical motion tenderness.   ED Results / Procedures / Treatments   Labs (all labs ordered are listed, but only abnormal results are displayed) Labs Reviewed  COMPREHENSIVE METABOLIC PANEL - Abnormal; Notable for the following components:      Result Value   Sodium 134 (*)    Glucose, Bld 384 (*)    AST 12 (*)    All other components within normal limits  URINALYSIS, ROUTINE W REFLEX MICROSCOPIC - Abnormal; Notable for the following components:   Color, Urine YELLOW (*)    APPearance HAZY (*)    Specific Gravity, Urine 1.036 (*)    Glucose, UA >=500 (*)    Hgb urine dipstick LARGE (*)    Ketones, ur 20 (*)    All other components within normal limits  CBC  HCG, QUANTITATIVE, PREGNANCY  POC URINE PREG, ED     I ordered and reviewed the above labs they are notable for normal H&H and an elevated glucose at 380 with a normal  anion gap  EKG  ED ECG REPORT I, Lucillie Garfinkel, the attending physician, personally viewed and interpreted this ECG.   Date: 08/17/2022  EKG Time: 0534  Rate: 101  Rhythm: sinus tachy  Axis: nl  Intervals:none  ST&T Change: non ischemic ecg    RADIOLOGY I independently reviewed and interpreted ultrasound of the pelvis and see normal-appearing ovaries   PROCEDURES:  Critical Care performed: No  Procedures   MEDICATIONS ORDERED IN ED: Medications  sodium chloride 0.9 % bolus 1,000 mL (1,000 mLs Intravenous New Bag/Given 08/17/22 0754)  insulin aspart (novoLOG) injection 5 Units (5 Units Intravenous Given 08/17/22 0755)  ibuprofen (ADVIL) tablet 600 mg (600 mg Oral Given 08/17/22 0940)    IMPRESSION / MDM / ASSESSMENT AND PLAN / ED COURSE  I reviewed the triage vital signs and the nursing notes.                                Patient's presentation is most consistent with acute presentation with potential threat to life or bodily  function.  Differential diagnosis includes, but is not limited to, acute blood loss anemia, dysrhythmia, intra-abdominal infection, ovarian torsion, TOA, ovarian cyst, STI/PID, considered but less likely CVA or ACS   The patient is on the cardiac monitor to evaluate for evidence of arrhythmia and/or significant heart rate changes.  MDM: Patient with late menstrual period and abdominal cramping pain with presyncopal episode after the site of blood after intercourse but now feels better with some residual cramping.  Cramping is unusual for her.  And she is late with her.  Though she has a tubal ligation will rule out pregnancy and pregnancy related complications.  Transvaginal ultrasound to rule out torsion.  I considered hospitalization for admission or observation but since the patient has stable vital signs, and unremarkable workup as above, plan will be for discharge at this time with outpatient follow-up and Gyn/PMD appointment later this week       FINAL CLINICAL IMPRESSION(S) / ED DIAGNOSES   Final diagnoses:  Vaginal bleeding  Abdominal cramping     Rx / DC Orders   ED Discharge Orders     None        Note:  This document was prepared using Dragon voice recognition software and may include unintentional dictation errors.    Lucillie Garfinkel, MD 08/17/22 [phone]

## 2022-08-17 NOTE — Discharge Instructions (Signed)
Take acetaminophen 650 mg and ibuprofen 400 mg every 6 hours for pain.  Take with food. Thank you for choosing [place] for your health care today!  Please see your primary doctor this week for a follow up appointment.   Sometimes, in the early stages of certain disease courses it is difficult to detect in the emergency department evaluation -- so, it is important that you continue to monitor your symptoms and call your doctor right away or return to the emergency department if you develop any new or worsening symptoms.  Please go to the following website to schedule new (and existing) patient appointments:   [email or web address]  If you do not have a primary doctor try calling the following clinics to establish care:  If you have insurance:  [facility] [phone] [place] [address]   [facility]  [phone] [address] [place] [address]   If you do not have insurance:  [facility]  [phone] [address], [place] [address]   The following is another list of primary care offices in the area who are accepting new patients at this time.  Please reach out to one of them directly and let them know you would like to schedule an appointment to follow up on an Emergency Department visit, and/or to establish a new primary care provider (PCP).  There are likely other primary care clinics in the are who are accepting new patients, but this is an excellent place to start:  [place] physician: Dr Lavon Paganini [address] [place], [place] [address] [phone]  [facility] Lead Physician: Dr Steele Sizer [address], [place], [place] [address] [phone]  [place] Physician: Dr Park Liter [address] [place], [place] [address] [phone]  [facility] Lead Physician: Dr Dewaine O[place], [place], [place] [address] [phone]  [facility] Physician: Dr Halina Maidens [address] [place], [place] [address] [phone]   It was my pleasure to care for you today.   Hoover Brunette Jacelyn Grip, MD

## 2022-08-17 NOTE — ED Notes (Signed)
Pt had near syncopal event near end of triage. Pt recovered and RN attempted to collect blood from patient. Pt noted to have 2 more near syncopal events. Pt placed in recliner and moved to subwait.   Blood work not obtained after 2 unsuccessful attempts.

## 2022-12-20 ENCOUNTER — Other Ambulatory Visit: Payer: Self-pay

## 2022-12-20 ENCOUNTER — Encounter: Payer: Self-pay | Admitting: Emergency Medicine

## 2022-12-20 ENCOUNTER — Emergency Department: Payer: Self-pay

## 2022-12-20 ENCOUNTER — Emergency Department
Admission: EM | Admit: 2022-12-20 | Discharge: 2022-12-20 | Disposition: A | Discharge status: Home / Self Care | Payer: Self-pay | Attending: Emergency Medicine | Admitting: Emergency Medicine

## 2022-12-20 DIAGNOSIS — R590 Localized enlarged lymph nodes: Secondary | ICD-10-CM | POA: Insufficient documentation

## 2022-12-20 DIAGNOSIS — E109 Type 1 diabetes mellitus without complications: Secondary | ICD-10-CM | POA: Insufficient documentation

## 2022-12-20 DIAGNOSIS — G44209 Tension-type headache, unspecified, not intractable: Secondary | ICD-10-CM

## 2022-12-20 LAB — CBC
HCT: 42.2 % (ref 36.0–46.0)
Hemoglobin: 14.5 g/dL (ref 12.0–15.0)
MCH: 30.8 pg (ref 26.0–34.0)
MCHC: 34.4 g/dL (ref 30.0–36.0)
MCV: 89.6 fL (ref 80.0–100.0)
Platelets: 167 10*3/uL (ref 150–400)
RBC: 4.71 MIL/uL (ref 3.87–5.11)
RDW: 12.1 % (ref 11.5–15.5)
WBC: 4.7 10*3/uL (ref 4.0–10.5)
nRBC: 0 % (ref 0.0–0.2)

## 2022-12-20 LAB — BASIC METABOLIC PANEL
Anion gap: 8 (ref 5–15)
BUN: 12 mg/dL (ref 6–20)
CO2: 21 mmol/L — ABNORMAL LOW (ref 22–32)
Calcium: 8.6 mg/dL — ABNORMAL LOW (ref 8.9–10.3)
Chloride: 108 mmol/L (ref 98–111)
Creatinine, Ser: 0.69 mg/dL (ref 0.44–1.00)
GFR, Estimated: >60 mL/min (ref >60)
Glucose, Bld: 203 mg/dL — ABNORMAL HIGH (ref 70–99)
Potassium: 4.2 mmol/L (ref 3.5–5.1)
Sodium: 137 mmol/L (ref 135–145)

## 2022-12-20 MED ORDER — KETOROLAC TROMETHAMINE 30 MG/ML IJ SOLN
15.0000 mg | Freq: Once | INTRAMUSCULAR | Status: AC
  Administered 2022-12-20: 15 mg via INTRAVENOUS
  Filled 2022-12-20: qty 1

## 2022-12-20 MED ORDER — SODIUM CHLORIDE 0.9 % IV BOLUS
1000.0000 mL | Freq: Once | INTRAVENOUS | Status: AC
  Administered 2022-12-20: 1000 mL via INTRAVENOUS

## 2022-12-20 MED ORDER — DEXAMETHASONE SODIUM PHOSPHATE 10 MG/ML IJ SOLN
10.0000 mg | Freq: Once | INTRAMUSCULAR | Status: AC
  Administered 2022-12-20: 10 mg via INTRAVENOUS
  Filled 2022-12-20: qty 1

## 2022-12-20 MED ORDER — IOHEXOL 300 MG/ML  SOLN
75.0000 mL | Freq: Once | INTRAMUSCULAR | Status: AC | PRN
  Administered 2022-12-20: 75 mL via INTRAVENOUS

## 2022-12-20 MED ORDER — TRAMADOL HCL 50 MG PO TABS: 50.0000 mg | ORAL_TABLET | Freq: Four times a day (QID) | ORAL | 0 refills | Status: DC | PRN

## 2022-12-20 MED ORDER — FENTANYL CITRATE PF 50 MCG/ML IJ SOSY
50.0000 ug | PREFILLED_SYRINGE | Freq: Once | INTRAMUSCULAR | Status: AC
  Administered 2022-12-20: 50 ug via INTRAVENOUS
  Filled 2022-12-20: qty 1

## 2022-12-20 MED ORDER — KETOROLAC TROMETHAMINE 10 MG PO TABS: 10.0000 mg | ORAL_TABLET | Freq: Four times a day (QID) | ORAL | 0 refills | Status: DC | PRN

## 2022-12-20 MED ORDER — OXYCODONE-ACETAMINOPHEN 5-325 MG PO TABS
1.0000 | ORAL_TABLET | Freq: Once | ORAL | Status: AC
  Administered 2022-12-20: 1 via ORAL
  Filled 2022-12-20: qty 1

## 2022-12-20 MED ORDER — DIPHENHYDRAMINE HCL 50 MG/ML IJ SOLN
12.5000 mg | Freq: Once | INTRAMUSCULAR | Status: AC
  Administered 2022-12-20: 12.5 mg via INTRAVENOUS
  Filled 2022-12-20: qty 1

## 2022-12-20 MED ORDER — CYCLOBENZAPRINE HCL 10 MG PO TABS
10.0000 mg | ORAL_TABLET | Freq: Once | ORAL | Status: AC
  Administered 2022-12-20: 10 mg via ORAL
  Filled 2022-12-20: qty 1

## 2022-12-20 MED ORDER — ONDANSETRON HCL 4 MG/2ML IJ SOLN
4.0000 mg | Freq: Once | INTRAMUSCULAR | Status: AC
  Administered 2022-12-20: 4 mg via INTRAVENOUS
  Filled 2022-12-20: qty 2

## 2022-12-20 MED ORDER — BACLOFEN 10 MG PO TABS: 10.0000 mg | ORAL_TABLET | Freq: Three times a day (TID) | ORAL | 0 refills | Status: AC

## 2022-12-20 NOTE — ED Provider Notes (Signed)
  Physical Exam  BP 137/88   Pulse 60   Temp 97.6 F (36.4 C) (Oral)   Resp 18   Ht 5' (1.524 m)   Wt 95.7 kg   LMP 12/10/2022   SpO2 100%   BMI 41.21 kg/m   Physical Exam  Procedures  Procedures  ED Course / MDM    Medical Decision Making Amount and/or Complexity of Data Reviewed Labs: ordered. Radiology: ordered.  Risk Prescription drug management.   In short receiving patient care from [facility], FNP.  Patient arrived with headache and some swollen lymph nodes on the right side of her neck.  States took Tylenol and Excedrin without any relief at home.  She has received 1 L normal saline, Toradol, Benadryl, fentanyl, and nausea medication.  States she did have some relief but it is worn off balance her head is starting to hurt again.   CT of the head and CT soft tissue of the neck, I did review the radiologist readings were independently reviewed interpreted by me as being negative for any acute abnormality   Will do a another liter of fluids, Percocet p.o., and will give her 10 of Decadron.  Patient states that her diabetes is not brittle.  If this does resolve with this medication, will discharge with pain medication.  Patient does appear to have a large muscle spasm in the right trapezius.  Would also give her muscle relaxer as feel this is more of a tension headache.  She is discharged in stable condition.    Faythe Ghee, PA-C 12/20/22 1708    Pilar Jarvis, MD 12/20/22 [phone]

## 2022-12-20 NOTE — ED Triage Notes (Signed)
Pt via POV from home. Pt c/o R sided headache that started last Thursday. Denies any head injury. Denies hx of frequent headaches or migraines. States she has a "knot" on the posterior aspect of the R side of her head and one on the R side of her neck. States she did not noticed them until a couple of days ago and that have progressively gotten bigger. Pt has a hx of DM Type I. Small "knot" felt by this RN on the R side of pt's neck. Denies any dizziness or blurred vision but does endorse some nausea. Pt is A&Ox4 and NAD, ambulatory to triage.

## 2022-12-20 NOTE — ED Provider Notes (Signed)
[facility] Provider Note    Event Date/Time   First MD Initiated Contact with Patient 12/20/22 1018     (approximate)   History   Headache   HPI  Cheryl Avila is a 27 y.o. female with history of type 1 diabetes, and as listed in EMR presents to the emergency department for treatment and evaluation of headache and feeling knots on the right side of her head and neck.  Headache started last Thursday. Knots noticed a couple of days ago and have gotten bigger. No recent illness. No subjective fever.    Physical Exam   Triage Vital Signs: ED Triage Vitals  Enc Vitals Group     BP 12/20/22 0953 (!) 137/90     Pulse Rate 12/20/22 0953 83     Resp 12/20/22 0953 18     Temp 12/20/22 0953 97.6 F (36.4 C)     Temp Source 12/20/22 0953 Oral     SpO2 12/20/22 0953 99 %     Weight 12/20/22 0949 211 lb (95.7 kg)     Height 12/20/22 0949 5' (1.524 m)     Head Circumference --      Peak Flow --      Pain Score 12/20/22 0949 10     Pain Loc --      Pain Edu? --      Excl. in GC? --     Most recent vital signs: Vitals:   12/20/22 0953 12/20/22 1149  BP: (!) 137/90 137/88  Pulse: 83 60  Resp: 18 18  Temp: 97.6 F (36.4 C)   SpO2: 99% 100%    General: Awake, no distress.  CV:  Good peripheral perfusion.  Resp:  Normal effort.  Abd:  No distention.  Other:  Lymph nodes in the anterior cervical chain palpable. Sunburn noted on exposed skin.   ED Results / Procedures / Treatments   Labs (all labs ordered are listed, but only abnormal results are displayed) Labs Reviewed  BASIC METABOLIC PANEL - Abnormal; Notable for the following components:      Result Value   CO2 21 (*)    Glucose, Bld 203 (*)    Calcium 8.6 (*)    All other components within normal limits  CBC     EKG  Not indicated   RADIOLOGY  Image and radiology report reviewed and interpreted by me. Radiology report consistent with the  same.  Pending.  PROCEDURES:  Critical Care performed: No  Procedures   MEDICATIONS ORDERED IN ED:  Medications  sodium chloride 0.9 % bolus 1,000 mL (0 mLs Intravenous Stopped 12/20/22 1318)  ondansetron (ZOFRAN) injection 4 mg (4 mg Intravenous Given 12/20/22 1145)  diphenhydrAMINE (BENADRYL) injection 12.5 mg (12.5 mg Intravenous Given 12/20/22 1146)  ketorolac (TORADOL) 30 MG/ML injection 15 mg (15 mg Intravenous Given 12/20/22 1308)  fentaNYL (SUBLIMAZE) injection 50 mcg (50 mcg Intravenous Given 12/20/22 1426)  iohexol (OMNIPAQUE) 300 MG/ML solution 75 mL (75 mLs Intravenous Contrast Given 12/20/22 1459)     IMPRESSION / MDM / ASSESSMENT AND PLAN / ED COURSE   I have reviewed the triage note.  Differential diagnosis includes, but is not limited to, migraine, tension headache, viral syndrome, lymphadenopathy,   Patient's presentation is most consistent with acute presentation with potential threat to life or bodily function.  27 year old female presents to the ER for evaluation of headache and "knots" in the right posterior scalp and right side neck. See HPI.  Plan will be to treat with fluids, Benadryl, and Zofran.  Neuroexam is unremarkable.  No indication for CT at this time.  Labs are pending.  Labs are all reassuring.  She has a normal white blood cell count.  Glucose is 203.  No improvement after medications.  She had reported an NSAID allergy however she states that the Naprosyn gives her a rash on her chest but she is able to take ibuprofen without any adverse effects.  Toradol ordered.  Fluids completed.  Patient reassessed.  Continues to complain of headache 6 out of 10 and is concerned about the knots that she feels on the back of her head in the right side of her neck.  Husband and patient requesting CT for more definitive diagnoses.  Head CT without contrast ordered and soft tissue neck CT ordered with contrast.  I considered Decadron however the patient is a type I  diabetic and would like to hold off on steroids unless absolutely necessary.  Fentanyl ordered for continued pain.  CT head and soft tissue neck pending. Reported off to Greig Right, PA-C who will follow up on scans and determine disposition.      FINAL CLINICAL IMPRESSION(S) / ED DIAGNOSES   Final diagnoses:  Lymphadenopathy, cervical     Rx / DC Orders   ED Discharge Orders     None        Note:  This document was prepared using Dragon voice recognition software and may include unintentional dictation errors.   Chinita Pester, FNP 12/20/22 1530    Jene Every, MD 12/20/22 [id number]

## 2022-12-20 NOTE — Discharge Instructions (Addendum)
Take medication as prescribed.  Return emergency department if worsening.  Follow-up with your regular doctor if not improving to 3 days.  Use medications as prescribed.  Due to the sliding scale for your glucose over the next few days as it may increase secondary to the steroid injection

## 2022-12-20 NOTE — Inpatient Diabetes Management (Signed)
Inpatient Diabetes Program Recommendations  AACE/ADA: New Consensus Statement on Inpatient Glycemic Control Target Ranges:  Prepandial:   less than 140 mg/dL      Peak postprandial:   less than 180 mg/dL (1-2 hours)      Critically ill patients:  140 - 180 mg/dL    Latest Reference Range & Units 12/20/22 09:55  CO2 22 - 32 mmol/L 21 (L)  Glucose 70 - 99 mg/dL 161 (H)  Anion gap 5 - 15  8   Review of Glycemic Control  Diabetes history: DM1 (does NOT make any insulin; requires basal, correction, and carbohydrate coverage insulin) Outpatient Diabetes medications: Levemir 15 units BID, Novolog 1 unit for every 20 mg/dl over 096 mg/dl, 1 unit for every 8 grams of carbohydrates Current orders for Inpatient glycemic control: None; in ED  Inpatient Diabetes Program Recommendations:    Insulin: If admitted, please consider ordering Semglee 25 units Q24H, CBGs AC&HS, Novolog 0-9 units AC&HS, and Novolog 4 units TID with meals for meal coverage if patient eats at least 50% of meals.  NOTE: Patient is currently in ED with headache. In reviewing chart, noted patient sees Dr. Gershon Crane [facility]) and was last seen 12/19/22; A1C was noted to be 9%. Per office note on 12/19/22, patient is prescribed Lantus 64 units QAM, Novolog 1 unit per 5 grams of carbohydrates, Novolog 1 unit per 25 mg/dl above target of 045 mg/dl. If patient is admitted, she will need to be ordered basal, correction, and meal coverage insulin (if ordered diet) since she makes NO insulin at all.  Thanks, Orlando Penner, RN, MSN, CDCES Diabetes Coordinator Inpatient Diabetes Program [phone] (Team Pager from 8am to 5pm)

## 2022-12-20 NOTE — ED Notes (Signed)
Patient ambulated to hallway bathroom with a steady gait. 

## 2022-12-23 ENCOUNTER — Emergency Department
Admission: EM | Admit: 2022-12-23 | Discharge: 2022-12-23 | Disposition: A | Discharge status: Home / Self Care | Payer: Self-pay | Attending: Student in an Organized Health Care Education/Training Program | Admitting: Student in an Organized Health Care Education/Training Program

## 2022-12-23 ENCOUNTER — Other Ambulatory Visit: Payer: Self-pay

## 2022-12-23 DIAGNOSIS — B029 Zoster without complications: Secondary | ICD-10-CM | POA: Insufficient documentation

## 2022-12-23 MED ORDER — CEPHALEXIN 500 MG PO CAPS: 500.0000 mg | ORAL_CAPSULE | Freq: Three times a day (TID) | ORAL | 0 refills | Status: AC

## 2022-12-23 MED ORDER — OXYCODONE-ACETAMINOPHEN 5-325 MG PO TABS
1.0000 | ORAL_TABLET | Freq: Once | ORAL | Status: AC
  Administered 2022-12-23: 1 via ORAL
  Filled 2022-12-23: qty 1

## 2022-12-23 MED ORDER — OXYCODONE-ACETAMINOPHEN 5-325 MG PO TABS: 1.0000 | ORAL_TABLET | ORAL | 0 refills | Status: DC | PRN

## 2022-12-23 MED ORDER — VALACYCLOVIR HCL 1 G PO TABS: 1000.0000 mg | ORAL_TABLET | Freq: Three times a day (TID) | ORAL | 0 refills | Status: AC

## 2022-12-23 MED ORDER — ONDANSETRON 4 MG PO TBDP: 4.0000 mg | ORAL_TABLET | Freq: Three times a day (TID) | ORAL | 0 refills | Status: DC | PRN

## 2022-12-23 MED ORDER — ONDANSETRON 4 MG PO TBDP
4.0000 mg | ORAL_TABLET | Freq: Once | ORAL | Status: AC
  Administered 2022-12-23: 4 mg via ORAL
  Filled 2022-12-23: qty 1

## 2022-12-23 NOTE — ED Provider Notes (Signed)
[facility] Provider Note    Event Date/Time   First MD Initiated Contact with Patient 12/23/22 1007     (approximate)   History   bumps on head   HPI  Cheryl Avila is a 27 y.o. female who presents to the ER for evaluation of persistent pain and rash on the right side of the posterior scalp with some swollen lymph nodes.  Seen recently had extensive workup without revealing etiology reports significant stressors at home.  Did have chickenpox as a child.  No fevers has had some nausea and generalized malaise.     Physical Exam   Triage Vital Signs: ED Triage Vitals  Encounter Vitals Group     BP 12/23/22 0958 (!) 119/92     Systolic BP Percentile --      Diastolic BP Percentile --      Pulse Rate 12/23/22 0958 73     Resp 12/23/22 0958 16     Temp 12/23/22 0958 98.4 F (36.9 C)     Temp Source 12/23/22 0958 Oral     SpO2 12/23/22 0958 100 %     Weight 12/23/22 0959 210 lb 15.7 oz (95.7 kg)     Height 12/23/22 0959 5' (1.524 m)     Head Circumference --      Peak Flow --      Pain Score 12/23/22 0958 10     Pain Loc --      Pain Education --      Exclude from Growth Chart --     Most recent vital signs: Vitals:   12/23/22 0958  BP: (!) 119/92  Pulse: 73  Resp: 16  Temp: 98.4 F (36.9 C)  SpO2: 100%     Constitutional: Alert  Eyes: Conjunctivae are normal.  Head: Atraumatic. Nose: No congestion/rhinnorhea. Mouth/Throat: Mucous membranes are moist.   Neck: Painless ROM.  Cardiovascular:   Good peripheral circulation. Respiratory: Normal respiratory effort.  No retractions.  Gastrointestinal: Soft and nontender.  Musculoskeletal:  no deformity Neurologic:  MAE spontaneously. No gross focal neurologic deficits are appreciated.  Skin:  Skin is warm, dry and intact.  Zoster like eruption on the right posterior scalp with tender posterior lymphadenopathy.  There is crusting of the lesions with some mild erythema where she has  been scratching them. Psychiatric: Mood and affect are normal. Speech and behavior are normal.    ED Results / Procedures / Treatments   Labs (all labs ordered are listed, but only abnormal results are displayed) Labs Reviewed - No data to display   EKG     RADIOLOGY    PROCEDURES:  Critical Care performed:   Procedures   MEDICATIONS ORDERED IN ED: Medications  oxyCODONE-acetaminophen (PERCOCET/ROXICET) 5-325 MG per tablet 1 tablet (has no administration in time range)     IMPRESSION / MDM / ASSESSMENT AND PLAN / ED COURSE  I reviewed the triage vital signs and the nursing notes.                              Differential diagnosis includes, but is not limited to, shingles, cellulitis, lymphangitis, lymphadenopathy,  Patient presented to the ER for evaluation of symptoms as described above consistent with shingles.  Will be placed on Valtrex.  Will give symptomatic treatment.  Given her history of diabetes we will hold off on steroid given duration of symptoms unlikely to add much benefit at this time.  Also has swollen lymph nodes and some findings concerning for superimposed cellulitis will cover with antibiotics.  She does appear stable and appropriate for outpatient follow-up.       FINAL CLINICAL IMPRESSION(S) / ED DIAGNOSES   Final diagnoses:  Herpes zoster without complication     Rx / DC Orders   ED Discharge Orders          Ordered    oxyCODONE-acetaminophen (PERCOCET) 5-325 MG tablet  Every 4 hours PRN        12/23/22 1048    valACYclovir (VALTREX) 1000 MG tablet  3 times daily        12/23/22 1048    cephALEXin (KEFLEX) 500 MG capsule  3 times daily        12/23/22 1048    ondansetron (ZOFRAN-ODT) 4 MG disintegrating tablet  Every 8 hours PRN        12/23/22 1048             Note:  This document was prepared using Dragon voice recognition software and may include unintentional dictation errors.    Willy Eddy, MD 12/23/22  1050

## 2022-12-23 NOTE — ED Triage Notes (Signed)
Pt to ED POV from [facility] for swollen areas under skin of scalp mostly on R side and posterior head since 1 week ago. Seen here 3-4 days ago for same and now there are more and that she was told they were caused bny stress and "stress don't cause this". Pt endorses 10/10 pain to swollen areas.

## 2022-12-26 ENCOUNTER — Other Ambulatory Visit: Payer: Self-pay

## 2022-12-26 ENCOUNTER — Emergency Department
Admission: EM | Admit: 2022-12-26 | Discharge: 2022-12-26 | Disposition: A | Discharge status: Home / Self Care | Payer: Self-pay | Attending: Emergency Medicine | Admitting: Emergency Medicine

## 2022-12-26 DIAGNOSIS — R739 Hyperglycemia, unspecified: Secondary | ICD-10-CM | POA: Insufficient documentation

## 2022-12-26 DIAGNOSIS — R519 Headache, unspecified: Secondary | ICD-10-CM | POA: Insufficient documentation

## 2022-12-26 LAB — CBG MONITORING, ED
Glucose-Capillary: 402 mg/dL — ABNORMAL HIGH (ref 70–99)
Glucose-Capillary: 363 mg/dL — ABNORMAL HIGH (ref 70–99)

## 2022-12-26 LAB — POC URINE PREG, ED: Preg Test, Ur: NEGATIVE

## 2022-12-26 LAB — BASIC METABOLIC PANEL
Anion gap: 13 (ref 5–15)
BUN: 18 mg/dL (ref 6–20)
CO2: 22 mmol/L (ref 22–32)
Calcium: 9.1 mg/dL (ref 8.9–10.3)
Chloride: 96 mmol/L — ABNORMAL LOW (ref 98–111)
Creatinine, Ser: 0.9 mg/dL (ref 0.44–1.00)
GFR, Estimated: >60 mL/min (ref >60)
Glucose, Bld: 486 mg/dL — ABNORMAL HIGH (ref 70–99)
Potassium: 4.7 mmol/L (ref 3.5–5.1)
Sodium: 131 mmol/L — ABNORMAL LOW (ref 135–145)

## 2022-12-26 LAB — URINALYSIS, ROUTINE W REFLEX MICROSCOPIC
Bacteria, UA: NONE SEEN
Bilirubin Urine: NEGATIVE
Glucose, UA: >=500 mg/dL — AB
Hgb urine dipstick: NEGATIVE
Ketones, ur: 80 mg/dL — AB
Leukocytes,Ua: NEGATIVE
Nitrite: NEGATIVE
Protein, ur: NEGATIVE mg/dL
Specific Gravity, Urine: 1.025 (ref 1.005–1.030)
pH: 5 (ref 5.0–8.0)

## 2022-12-26 LAB — CBC
HCT: 43.3 % (ref 36.0–46.0)
Hemoglobin: 15 g/dL (ref 12.0–15.0)
MCH: 31.1 pg (ref 26.0–34.0)
MCHC: 34.6 g/dL (ref 30.0–36.0)
MCV: 89.6 fL (ref 80.0–100.0)
Platelets: 283 10*3/uL (ref 150–400)
RBC: 4.83 MIL/uL (ref 3.87–5.11)
RDW: 11.8 % (ref 11.5–15.5)
WBC: 9.8 10*3/uL (ref 4.0–10.5)
nRBC: 0 % (ref 0.0–0.2)

## 2022-12-26 MED ORDER — INSULIN ASPART 100 UNIT/ML IJ SOLN
8.0000 [IU] | Freq: Once | INTRAMUSCULAR | Status: AC
  Administered 2022-12-26: 8 [IU] via SUBCUTANEOUS
  Filled 2022-12-26: qty 1

## 2022-12-26 MED ORDER — PROMETHAZINE HCL 25 MG PO TABS: 25.0000 mg | ORAL_TABLET | Freq: Four times a day (QID) | ORAL | 0 refills | Status: DC | PRN

## 2022-12-26 MED ORDER — GABAPENTIN 300 MG PO CAPS
300.0000 mg | ORAL_CAPSULE | Freq: Once | ORAL | Status: AC
  Administered 2022-12-26: 300 mg via ORAL
  Filled 2022-12-26: qty 1

## 2022-12-26 MED ORDER — KETOROLAC TROMETHAMINE 15 MG/ML IJ SOLN
15.0000 mg | Freq: Once | INTRAMUSCULAR | Status: AC
  Administered 2022-12-26: 15 mg via INTRAVENOUS
  Filled 2022-12-26: qty 1

## 2022-12-26 MED ORDER — PROCHLORPERAZINE EDISYLATE 10 MG/2ML IJ SOLN
10.0000 mg | Freq: Once | INTRAMUSCULAR | Status: AC
  Administered 2022-12-26: 10 mg via INTRAVENOUS
  Filled 2022-12-26: qty 2

## 2022-12-26 MED ORDER — GABAPENTIN 300 MG PO CAPS: 300.0000 mg | ORAL_CAPSULE | Freq: Two times a day (BID) | ORAL | 0 refills | Status: DC | PRN

## 2022-12-26 MED ORDER — SODIUM CHLORIDE 0.9 % IV BOLUS
1000.0000 mL | Freq: Once | INTRAVENOUS | Status: AC
  Administered 2022-12-26: 1000 mL via INTRAVENOUS

## 2022-12-26 MED ORDER — DIPHENHYDRAMINE HCL 50 MG/ML IJ SOLN
25.0000 mg | Freq: Once | INTRAMUSCULAR | Status: AC
  Administered 2022-12-26: 25 mg via INTRAVENOUS
  Filled 2022-12-26: qty 1

## 2022-12-26 NOTE — ED Provider Notes (Signed)
[facility] Provider Note    Event Date/Time   First MD Initiated Contact with Patient 12/26/22 1344     (approximate)   History   Dizziness   HPI  Cheryl Avila is a 27 y.o. female presenting to the emergency department for evaluation of headache.  Patient reports that for a few weeks she has had an ongoing right-sided headache.  Not sudden in onset or different than prior, but has been persistent.  No vision changes.  She was seen here on 7/10 at which time she had a negative CT head and CT soft tissue neck in the setting of lymphadenopathy.  She reports that her swollen lymph nodes have improved, but she continues to have head pain.  She returned on 7/13 and was noted to have vesicular lesions concerning for zoster.  She was discharged on Percocet, Valtrex, Keflex, Zofran.  She reports that she does continue to have some headache and nausea despite this.  Presents back to the ER for this.  No fevers.  No change in her symptoms.  No vision changes.     Physical Exam   Triage Vital Signs: ED Triage Vitals [12/26/22 1245]  Encounter Vitals Group     BP (!) 152/86     Systolic BP Percentile      Diastolic BP Percentile      Pulse Rate (!) 101     Resp 18     Temp 98 F (36.7 C)     Temp Source Oral     SpO2 100 %     Weight 210 lb 15.7 oz (95.7 kg)     Height 5' (1.524 m)     Head Circumference      Peak Flow      Pain Score 10     Pain Loc      Pain Education      Exclude from Growth Chart     Most recent vital signs: Vitals:   12/26/22 1245 12/26/22 1715  BP: (!) 152/86 (!) 95/50  Pulse: (!) 101 80  Resp: 18 15  Temp: 98 F (36.7 C) 98.3 F (36.8 C)  SpO2: 100% 100%     General: Awake, interactive  HEENT: Head atraumatic, no significant appreciable lymphadenopathy.  There are a few papular, somewhat vesicular appearing lesions over the posterior ear extending into the scalp without active drainage.  There are no lesions within  the ear.  Tympanic membrane is clear over the right. CV:  Regular rate, good peripheral perfusion.  Resp:  Lungs clear, unlabored respirations.  Abd:  Soft, nondistended.  Neuro:  Symmetric facial movement, fluid speech, 5 out of 5 strength in bilateral upper and lower extremities with normal sensation, normal finger-nose testing, normal coordination   ED Results / Procedures / Treatments   Labs (all labs ordered are listed, but only abnormal results are displayed) Labs Reviewed  BASIC METABOLIC PANEL - Abnormal; Notable for the following components:      Result Value   Sodium 131 (*)    Chloride 96 (*)    Glucose, Bld 486 (*)    All other components within normal limits  URINALYSIS, ROUTINE W REFLEX MICROSCOPIC - Abnormal; Notable for the following components:   Color, Urine STRAW (*)    APPearance CLEAR (*)    Glucose, UA >=500 (*)    Ketones, ur 80 (*)    All other components within normal limits  CBG MONITORING, ED - Abnormal; Notable for the  following components:   Glucose-Capillary 402 (*)    All other components within normal limits  CBG MONITORING, ED - Abnormal; Notable for the following components:   Glucose-Capillary 363 (*)    All other components within normal limits  CBC  POC URINE PREG, ED     EKG EKG independently reviewed interpreted by myself (ER attending) demonstrates:  EKG demonstrates sinus rhythm at a rate of 93, PR 140, QRS 68, QTc 397, no acute ST changes  RADIOLOGY Imaging independently reviewed and interpreted by myself demonstrates:    PROCEDURES:  Critical Care performed: No  Procedures   MEDICATIONS ORDERED IN ED: Medications  sodium chloride 0.9 % bolus 1,000 mL (0 mLs Intravenous Stopped 12/26/22 1712)  gabapentin (NEURONTIN) capsule 300 mg (300 mg Oral Given 12/26/22 1448)  ketorolac (TORADOL) 15 MG/ML injection 15 mg (15 mg Intravenous Given 12/26/22 1448)  diphenhydrAMINE (BENADRYL) injection 25 mg (25 mg Intravenous Given 12/26/22  1448)  prochlorperazine (COMPAZINE) injection 10 mg (10 mg Intravenous Given 12/26/22 1448)  insulin aspart (novoLOG) injection 8 Units (8 Units Subcutaneous Given 12/26/22 1820)     IMPRESSION / MDM / ASSESSMENT AND PLAN / ED COURSE  I reviewed the triage vital signs and the nursing notes.  Differential diagnosis includes, but is not limited to, postherpetic neuralgia, tension headache, migraine, low suspicion for acute intracranial process given recent negative CT, absence of focal neurologic findings on exam, lack of progressive symptoms  Patient's presentation is most consistent with acute illness / injury with system symptoms.  27 year old female presenting to the emergency department for evaluation ongoing headache.  Neurologic exam reassuring.  Lab work demonstrates hyperglycemia without DKA with normal bicarb and anion gap.  Urinalysis is without evidence of infection, but does demonstrate ketones that I suspect are likely secondary to dehydration.  Patient with recent extensive reassuring workup.  Will plan to treat symptomatically with headache cocktail and IV fluids.  Will trial gabapentin given concern for zoster during recent ER visit, though these lesions do seem to be improving on exam today.  On reevaluation, patient reports significant improvement in her headache.  This is now her third ER visit and her workup does demonstrate evidence of dehydration, so I did discuss admission to the hospital for further evaluation.  However, patient reports she feels significantly improved.  She has been tolerating p.o. throughout her ER stay.  She did have hyperglycemia, but had not taken her home insulin and improved with insulin here.  In the setting of this, do think discharge is reasonable.  Will discharge with prescription for gabapentin and Phenergan.  Strict return precautions provided.  Patient discharged in stable condition.    FINAL CLINICAL IMPRESSION(S) / ED DIAGNOSES   Final  diagnoses:  Acute nonintractable headache, unspecified headache type  Hyperglycemia     Rx / DC Orders   ED Discharge Orders          Ordered    gabapentin (NEURONTIN) 300 MG capsule  2 times daily PRN        12/26/22 1945    promethazine (PHENERGAN) 25 MG tablet  Every 6 hours PRN        12/26/22 1945             Note:  This document was prepared using Dragon voice recognition software and may include unintentional dictation errors.   Trinna Post, MD 12/26/22 2001

## 2022-12-26 NOTE — ED Notes (Signed)
Pt had imaging done on 12/20/2022.

## 2022-12-26 NOTE — ED Notes (Signed)
Pt refusing blood work, stating she passes out when she gets her blood drawn.

## 2022-12-26 NOTE — Discharge Instructions (Addendum)
You were seen in the ER today for your ongoing headache.  I am glad you are feeling better after receiving medicine here.  I have sent a prescription for a different pain medicine called gabapentin to your pharmacy that you can take  to help with your pain.  I have also sent a prescription for a different nausea medicine called phenergan that you can take as needed.  These can make you drowsy, do not drive or operate machinery when taking these.  Follow-up your primary care doctor within a few days for reevaluation.  Return to the ER for new or worsening symptoms.

## 2022-12-26 NOTE — ED Triage Notes (Signed)
Pt here with dizziness, neck pain and lightheadedness. Pt states she had shingles x2 weeks. Pt also having vomiting. Pt c/o head and neck pain. Pt ambulatory to triage.

## 2023-02-25 ENCOUNTER — Emergency Department
Admission: EM | Admit: 2023-02-25 | Discharge: 2023-02-25 | Disposition: A | Discharge status: Home / Self Care | Payer: Self-pay | Attending: Emergency Medicine | Admitting: Emergency Medicine

## 2023-02-25 ENCOUNTER — Other Ambulatory Visit: Payer: Self-pay

## 2023-02-25 ENCOUNTER — Emergency Department: Payer: Self-pay

## 2023-02-25 DIAGNOSIS — E1065 Type 1 diabetes mellitus with hyperglycemia: Secondary | ICD-10-CM | POA: Insufficient documentation

## 2023-02-25 DIAGNOSIS — R739 Hyperglycemia, unspecified: Secondary | ICD-10-CM

## 2023-02-25 DIAGNOSIS — R1900 Intra-abdominal and pelvic swelling, mass and lump, unspecified site: Secondary | ICD-10-CM | POA: Insufficient documentation

## 2023-02-25 LAB — URINALYSIS, ROUTINE W REFLEX MICROSCOPIC
Bacteria, UA: NONE SEEN
Bilirubin Urine: NEGATIVE
Glucose, UA: >=500 mg/dL — AB
Hgb urine dipstick: NEGATIVE
Ketones, ur: NEGATIVE mg/dL
Leukocytes,Ua: NEGATIVE
Nitrite: NEGATIVE
Protein, ur: NEGATIVE mg/dL
RBC / HPF: 0 RBC/hpf (ref 0–5)
Specific Gravity, Urine: 1.027 (ref 1.005–1.030)
pH: 5 (ref 5.0–8.0)

## 2023-02-25 LAB — CBG MONITORING, ED
Glucose-Capillary: 305 mg/dL — ABNORMAL HIGH (ref 70–99)
Glucose-Capillary: 42 mg/dL — CL (ref 70–99)
Glucose-Capillary: 51 mg/dL — ABNORMAL LOW (ref 70–99)
Glucose-Capillary: 56 mg/dL — ABNORMAL LOW (ref 70–99)
Glucose-Capillary: 103 mg/dL — ABNORMAL HIGH (ref 70–99)
Glucose-Capillary: 107 mg/dL — ABNORMAL HIGH (ref 70–99)

## 2023-02-25 LAB — CBC
HCT: 40 % (ref 36.0–46.0)
Hemoglobin: 13.6 g/dL (ref 12.0–15.0)
MCH: 32 pg (ref 26.0–34.0)
MCHC: 34 g/dL (ref 30.0–36.0)
MCV: 94.1 fL (ref 80.0–100.0)
Platelets: 213 10*3/uL (ref 150–400)
RBC: 4.25 MIL/uL (ref 3.87–5.11)
RDW: 13.2 % (ref 11.5–15.5)
WBC: 5.6 10*3/uL (ref 4.0–10.5)
nRBC: 0 % (ref 0.0–0.2)

## 2023-02-25 LAB — COMPREHENSIVE METABOLIC PANEL
ALT: 35 U/L (ref 0–44)
AST: 38 U/L (ref 15–41)
Albumin: 3.4 g/dL — ABNORMAL LOW (ref 3.5–5.0)
Alkaline Phosphatase: 58 U/L (ref 38–126)
Anion gap: 9 (ref 5–15)
BUN: 12 mg/dL (ref 6–20)
CO2: 25 mmol/L (ref 22–32)
Calcium: 8.5 mg/dL — ABNORMAL LOW (ref 8.9–10.3)
Chloride: 101 mmol/L (ref 98–111)
Creatinine, Ser: 0.7 mg/dL (ref 0.44–1.00)
GFR, Estimated: >60 mL/min (ref >60)
Glucose, Bld: 349 mg/dL — ABNORMAL HIGH (ref 70–99)
Potassium: 3.9 mmol/L (ref 3.5–5.1)
Sodium: 135 mmol/L (ref 135–145)
Total Bilirubin: 0.6 mg/dL (ref 0.3–1.2)
Total Protein: 6 g/dL — ABNORMAL LOW (ref 6.5–8.1)

## 2023-02-25 LAB — POC URINE PREG, ED: Preg Test, Ur: NEGATIVE

## 2023-02-25 LAB — BRAIN NATRIURETIC PEPTIDE: B Natriuretic Peptide: 66.1 pg/mL (ref 0.0–100.0)

## 2023-02-25 MED ORDER — SODIUM CHLORIDE 0.9 % IV BOLUS
1000.0000 mL | Freq: Once | INTRAVENOUS | Status: AC
  Administered 2023-02-25: 1000 mL via INTRAVENOUS

## 2023-02-25 MED ORDER — DEXTROSE 50 % IV SOLN
12.5000 g | Freq: Once | INTRAVENOUS | Status: AC
  Administered 2023-02-25: 12.5 g via INTRAVENOUS
  Filled 2023-02-25: qty 50

## 2023-02-25 MED ORDER — INSULIN ASPART 100 UNIT/ML IJ SOLN
8.0000 [IU] | Freq: Once | INTRAMUSCULAR | Status: AC
  Administered 2023-02-25: 8 [IU] via INTRAVENOUS
  Filled 2023-02-25: qty 1

## 2023-02-25 MED ORDER — IOHEXOL 300 MG/ML  SOLN
100.0000 mL | Freq: Once | INTRAMUSCULAR | Status: AC | PRN
  Administered 2023-02-25: 100 mL via INTRAVENOUS

## 2023-02-25 NOTE — ED Provider Notes (Signed)
[facility] Provider Note    Event Date/Time   First MD Initiated Contact with Patient 02/25/23 1457     (approximate)   History   Hyperglycemia   HPI  Cheryl Avila is a 27 year old female with history of type 1 diabetes presenting to the emergency department for evaluation of abdominal pain and swelling.  For the last week, patient has noticed abdominal discomfort and has not had a bowel movement.  She has also noticed some swelling in her face and extremities.  She did notice that she was running low on insulin about a week ago and was concerned about her ability to pay for her prescriptions, so she has been giving herself partial doses to make her insulin last longer.  She did speak to her primary care doctor about this and he is changed her pharmacy so that her insulin will now be more affordable and patient reports she can now get this filled.  However, she was concerned about her swelling and abdominal discomfort.  Has not had a bowel movement for several days.  History of 3 prior C-sections.  Associated decreased p.o. intake.  Reports nausea without vomiting.  Denies history of similar.  No chest pain or shortness of breath.      Physical Exam   Triage Vital Signs: ED Triage Vitals  Encounter Vitals Group     BP 02/25/23 1317 (!) 136/96     Systolic BP Percentile --      Diastolic BP Percentile --      Pulse Rate 02/25/23 1317 81     Resp 02/25/23 1317 17     Temp 02/25/23 1317 97.9 F (36.6 C)     Temp Source 02/25/23 1317 Oral     SpO2 02/25/23 1317 100 %     Weight 02/25/23 1324 208 lb (94.3 kg)     Height 02/25/23 1324 5' (1.524 m)     Head Circumference --      Peak Flow --      Pain Score 02/25/23 1324 7     Pain Loc --      Pain Education --      Exclude from Growth Chart --     Most recent vital signs: Vitals:   02/25/23 1317 02/25/23 1610  BP: (!) 136/96 (!) 131/97  Pulse: 81 90  Resp: 17   Temp: 97.9 F (36.6 C)    SpO2: 100% 100%     General: Awake, interactive  CV:  Regular rate, good peripheral perfusion.  Resp:  Lungs clear, unlabored respirations.  Abd:  Soft, nondistended, mild generalized tenderness to palpation most notable in the suprapubic region, no rebound or guarding Neuro:  Symmetric facial movement, fluid speech   ED Results / Procedures / Treatments   Labs (all labs ordered are listed, but only abnormal results are displayed) Labs Reviewed  URINALYSIS, ROUTINE W REFLEX MICROSCOPIC - Abnormal; Notable for the following components:      Result Value   Color, Urine STRAW (*)    APPearance CLEAR (*)    Glucose, UA >=500 (*)    All other components within normal limits  COMPREHENSIVE METABOLIC PANEL - Abnormal; Notable for the following components:   Glucose, Bld 349 (*)    Calcium 8.5 (*)    Total Protein 6.0 (*)    Albumin 3.4 (*)    All other components within normal limits  CBG MONITORING, ED - Abnormal; Notable for the following components:   Glucose-Capillary  305 (*)    All other components within normal limits  CBG MONITORING, ED - Abnormal; Notable for the following components:   Glucose-Capillary 42 (*)    All other components within normal limits  CBG MONITORING, ED - Abnormal; Notable for the following components:   Glucose-Capillary 51 (*)    All other components within normal limits  CBG MONITORING, ED - Abnormal; Notable for the following components:   Glucose-Capillary 56 (*)    All other components within normal limits  CBG MONITORING, ED - Abnormal; Notable for the following components:   Glucose-Capillary 103 (*)    All other components within normal limits  CBG MONITORING, ED - Abnormal; Notable for the following components:   Glucose-Capillary 107 (*)    All other components within normal limits  CBC  BRAIN NATRIURETIC PEPTIDE  POC URINE PREG, ED     EKG EKG independently reviewed interpreted by myself (ER attending) demonstrates:     RADIOLOGY Imaging independently reviewed and interpreted by myself demonstrates:  CT abd pelvis without obstruction or other acute abnormality  PROCEDURES:  Critical Care performed: No  Procedures   MEDICATIONS ORDERED IN ED: Medications  insulin aspart (novoLOG) injection 8 Units (8 Units Intravenous Given 02/25/23 1605)  sodium chloride 0.9 % bolus 1,000 mL (0 mLs Intravenous Stopped 02/25/23 1750)  iohexol (OMNIPAQUE) 300 MG/ML solution 100 mL (100 mLs Intravenous Contrast Given 02/25/23 1617)  dextrose 50 % solution 12.5 g (12.5 g Intravenous Given 02/25/23 1721)     IMPRESSION / MDM / ASSESSMENT AND PLAN / ED COURSE  I reviewed the triage vital signs and the nursing notes.  Differential diagnosis includes, but is not limited to, DKA, hyperglycemia without DKA, swelling secondary to renal dysfunction, CHF, electrolyte abnormality  Patient's presentation is most consistent with acute presentation with potential threat to life or bodily function.  27 year old female presenting with hyperglycemia, abdominal and body swelling.  Vital signs stable on presentation.  Labs sent from triage notable for hyperglycemia with glucose of 349, but without evidence of DKA with a normal bicarb of 25 and anion gap of 9.  BNP normal.  Urinalysis without evidence of infection or protein.  With abdominal pain, and decreased bowel movements will obtain CT to ensure that she does not have obstruction or other acute process.  Will also give her IV fluids and insulin based on her reported correction. If this is reassuring, do think she will be stable for discharge with restarting her home medications at appropriate doses and outpatient follow-up.  CT reassuring. Clinical Course as of 02/25/23 1610  Wynelle Link Feb 25, 2023  1708 Glucose-Capillary(!!): 42 Glucose rechecked after receiving insulin and noted to be low at 42.  Patient awake, responsive.  Will p.o. and continue to monitor. [NR]  1745  Glucose-Capillary(!): 103 Patient did have some persistent hypoglycemia even after some oral intake, so she was given 12.5 g of D50 with improvement in her glucose to 103.  Will observe patient to ensure that she does not have recurrent hypoglycemia. [NR]  1846 Glucose-Capillary(!): 107 Glucose remained stable on repeat.  Do think patient is stable to be discharged at this time.  Strict return precautions provided.  Patient discharged stable condition. [NR]    Clinical Course User Index [NR] Trinna Post, MD      FINAL CLINICAL IMPRESSION(S) / ED DIAGNOSES   Final diagnoses:  Hyperglycemia  Abdominal swelling     Rx / DC Orders   ED Discharge Orders  None        Note:  This document was prepared using Dragon voice recognition software and may include unintentional dictation errors.   Trinna Post, MD 02/25/23 [phone]

## 2023-02-25 NOTE — ED Notes (Signed)
Pt given 2 orange juices. Pt reports feeling sluggish.

## 2023-02-25 NOTE — ED Triage Notes (Signed)
Pt via POV from home. States that she has been having abd swelling, bilateral hand swelling, and face swelling for the past week. Pt has a hx of Type DM I has been out of her insulin for the past week, reports that her meter reads high. States that her PCP stated that she has kidney issues but never said anything or done anything regarding that. Denies SOB. Pt is A&OX4 and NAD

## 2023-02-25 NOTE — Discharge Instructions (Addendum)
You were seen in the emergency department today for evaluation of swelling and elevated blood sugar.  Your blood sugar was high, but fortunately we did not see signs of DKA.  We did not see an emergency cause for your swelling.  Please fill your prescriptions and take them as directed.  You did drop your blood sugar here after receiving insulin, so please keep a log of your blood sugars at home to ensure that you are receiving appropriate doses.  I have included some information about treating constipation below.  Return to the ER for new or worsening symptoms.  To help prevent constipation in the future please: Eat a high-fiber diet Drink water and other fluids during the day Go to the bathroom at regular times every day  To treat your constipation:  Take a capful of MiraLAX 1-2 times daily You can also try over-the-counter stool softener such as Dulcolax or medication such as milk of magnesium You can use over-the-counter medications such as senna, but only use this for a few days, do not use this for prolonged period If you are still having constipation, you can try an over-the-counter suppository or enema

## 2023-02-25 NOTE — ED Notes (Signed)
Pt back from CT

## 2023-02-25 NOTE — ED Notes (Signed)
Pt given 2 more OJ.

## 2023-06-13 ENCOUNTER — Other Ambulatory Visit: Payer: Self-pay

## 2023-06-13 ENCOUNTER — Encounter: Payer: Self-pay | Admitting: Internal Medicine

## 2023-06-13 ENCOUNTER — Observation Stay
Admission: EM | Admit: 2023-06-13 | Discharge: 2023-06-14 | DRG: 638 | Disposition: A | Discharge status: Home / Self Care | Payer: Self-pay | Attending: Internal Medicine | Admitting: Internal Medicine

## 2023-06-13 DIAGNOSIS — Z79899 Other long term (current) drug therapy: Secondary | ICD-10-CM | POA: Diagnosis not present

## 2023-06-13 DIAGNOSIS — Z833 Family history of diabetes mellitus: Secondary | ICD-10-CM

## 2023-06-13 DIAGNOSIS — E1010 Type 1 diabetes mellitus with ketoacidosis without coma: Secondary | ICD-10-CM | POA: Diagnosis not present

## 2023-06-13 DIAGNOSIS — E669 Obesity, unspecified: Secondary | ICD-10-CM | POA: Diagnosis present

## 2023-06-13 DIAGNOSIS — Z886 Allergy status to analgesic agent status: Secondary | ICD-10-CM | POA: Diagnosis not present

## 2023-06-13 DIAGNOSIS — Z6835 Body mass index (BMI) 35.0-35.9, adult: Secondary | ICD-10-CM

## 2023-06-13 DIAGNOSIS — D72829 Elevated white blood cell count, unspecified: Secondary | ICD-10-CM | POA: Diagnosis not present

## 2023-06-13 DIAGNOSIS — N179 Acute kidney failure, unspecified: Secondary | ICD-10-CM

## 2023-06-13 DIAGNOSIS — Z794 Long term (current) use of insulin: Secondary | ICD-10-CM

## 2023-06-13 DIAGNOSIS — E860 Dehydration: Secondary | ICD-10-CM | POA: Diagnosis not present

## 2023-06-13 DIAGNOSIS — Z5971 Insufficient health insurance coverage: Secondary | ICD-10-CM | POA: Diagnosis not present

## 2023-06-13 DIAGNOSIS — K219 Gastro-esophageal reflux disease without esophagitis: Secondary | ICD-10-CM | POA: Diagnosis not present

## 2023-06-13 DIAGNOSIS — E1065 Type 1 diabetes mellitus with hyperglycemia: Secondary | ICD-10-CM

## 2023-06-13 DIAGNOSIS — E871 Hypo-osmolality and hyponatremia: Secondary | ICD-10-CM | POA: Diagnosis present

## 2023-06-13 DIAGNOSIS — E1110 Type 2 diabetes mellitus with ketoacidosis without coma: Secondary | ICD-10-CM | POA: Diagnosis present

## 2023-06-13 LAB — BASIC METABOLIC PANEL
Anion gap: 11 (ref 5–15)
Anion gap: 15 (ref 5–15)
BUN: 13 mg/dL (ref 6–20)
BUN: 18 mg/dL (ref 6–20)
CO2: 13 mmol/L — ABNORMAL LOW (ref 22–32)
CO2: 14 mmol/L — ABNORMAL LOW (ref 22–32)
Calcium: 8.1 mg/dL — ABNORMAL LOW (ref 8.9–10.3)
Calcium: 8.5 mg/dL — ABNORMAL LOW (ref 8.9–10.3)
Chloride: 106 mmol/L (ref 98–111)
Chloride: 108 mmol/L (ref 98–111)
Creatinine, Ser: 0.85 mg/dL (ref 0.44–1.00)
Creatinine, Ser: 1.17 mg/dL — ABNORMAL HIGH (ref 0.44–1.00)
GFR, Estimated: >60 mL/min (ref >60)
GFR, Estimated: >60 mL/min (ref >60)
Glucose, Bld: 170 mg/dL — ABNORMAL HIGH (ref 70–99)
Glucose, Bld: 214 mg/dL — ABNORMAL HIGH (ref 70–99)
Potassium: 4 mmol/L (ref 3.5–5.1)
Potassium: 4.6 mmol/L (ref 3.5–5.1)
Sodium: 133 mmol/L — ABNORMAL LOW (ref 135–145)
Sodium: 134 mmol/L — ABNORMAL LOW (ref 135–145)

## 2023-06-13 LAB — CBC WITH DIFFERENTIAL/PLATELET
Abs Immature Granulocytes: 0.08 10*3/uL — ABNORMAL HIGH (ref 0.00–0.07)
Basophils Absolute: 0.1 10*3/uL (ref 0.0–0.1)
Basophils Relative: 1 %
Eosinophils Absolute: 0 10*3/uL (ref 0.0–0.5)
Eosinophils Relative: 0 %
HCT: 51 % — ABNORMAL HIGH (ref 36.0–46.0)
Hemoglobin: 16.8 g/dL — ABNORMAL HIGH (ref 12.0–15.0)
Immature Granulocytes: 1 %
Lymphocytes Relative: 14 %
Lymphs Abs: 2 10*3/uL (ref 0.7–4.0)
MCH: 31.9 pg (ref 26.0–34.0)
MCHC: 32.9 g/dL (ref 30.0–36.0)
MCV: 96.8 fL (ref 80.0–100.0)
Monocytes Absolute: 0.8 10*3/uL (ref 0.1–1.0)
Monocytes Relative: 5 %
Neutro Abs: 11.7 10*3/uL — ABNORMAL HIGH (ref 1.7–7.7)
Neutrophils Relative %: 79 %
Platelets: 466 10*3/uL — ABNORMAL HIGH (ref 150–400)
RBC: 5.27 MIL/uL — ABNORMAL HIGH (ref 3.87–5.11)
RDW: 12.5 % (ref 11.5–15.5)
WBC: 14.6 10*3/uL — ABNORMAL HIGH (ref 4.0–10.5)
nRBC: 0 % (ref 0.0–0.2)

## 2023-06-13 LAB — COMPREHENSIVE METABOLIC PANEL
ALT: 39 U/L (ref 0–44)
AST: 17 U/L (ref 15–41)
Albumin: 4.5 g/dL (ref 3.5–5.0)
Alkaline Phosphatase: 89 U/L (ref 38–126)
Anion gap: 22 — ABNORMAL HIGH (ref 5–15)
BUN: 18 mg/dL (ref 6–20)
CO2: 9 mmol/L — ABNORMAL LOW (ref 22–32)
Calcium: 8.9 mg/dL (ref 8.9–10.3)
Chloride: 107 mmol/L (ref 98–111)
Creatinine, Ser: 1.31 mg/dL — ABNORMAL HIGH (ref 0.44–1.00)
GFR, Estimated: 57 mL/min — ABNORMAL LOW (ref >60)
Glucose, Bld: 255 mg/dL — ABNORMAL HIGH (ref 70–99)
Potassium: 4.7 mmol/L (ref 3.5–5.1)
Sodium: 138 mmol/L (ref 135–145)
Total Bilirubin: 1.4 mg/dL — ABNORMAL HIGH (ref 0.0–1.2)
Total Protein: 8.1 g/dL (ref 6.5–8.1)

## 2023-06-13 LAB — URINALYSIS, ROUTINE W REFLEX MICROSCOPIC
Bilirubin Urine: NEGATIVE
Glucose, UA: >=500 mg/dL — AB
Ketones, ur: 80 mg/dL — AB
Leukocytes,Ua: NEGATIVE
Nitrite: NEGATIVE
Protein, ur: 30 mg/dL — AB
Specific Gravity, Urine: 1.023 (ref 1.005–1.030)
pH: 5 (ref 5.0–8.0)

## 2023-06-13 LAB — PREGNANCY, URINE: Preg Test, Ur: NEGATIVE

## 2023-06-13 LAB — CBG MONITORING, ED
Glucose-Capillary: 150 mg/dL — ABNORMAL HIGH (ref 70–99)
Glucose-Capillary: 170 mg/dL — ABNORMAL HIGH (ref 70–99)
Glucose-Capillary: 180 mg/dL — ABNORMAL HIGH (ref 70–99)
Glucose-Capillary: 182 mg/dL — ABNORMAL HIGH (ref 70–99)
Glucose-Capillary: 214 mg/dL — ABNORMAL HIGH (ref 70–99)
Glucose-Capillary: 263 mg/dL — ABNORMAL HIGH (ref 70–99)

## 2023-06-13 LAB — BETA-HYDROXYBUTYRIC ACID: Beta-Hydroxybutyric Acid: 5.96 mmol/L — ABNORMAL HIGH (ref 0.05–0.27)

## 2023-06-13 LAB — HCG, QUANTITATIVE, PREGNANCY: hCG, Beta Chain, Quant, S: <1 m[IU]/mL (ref <5)

## 2023-06-13 LAB — MAGNESIUM: Magnesium: 2.4 mg/dL (ref 1.7–2.4)

## 2023-06-13 MED ORDER — FAMOTIDINE 20 MG PO TABS
20.0000 mg | ORAL_TABLET | Freq: Two times a day (BID) | ORAL | Status: DC
  Administered 2023-06-13: 20 mg via ORAL
  Filled 2023-06-13: qty 1

## 2023-06-13 MED ORDER — DEXTROSE 50 % IV SOLN: 0.0000 mL | INTRAVENOUS | Status: DC | PRN

## 2023-06-13 MED ORDER — OXYCODONE-ACETAMINOPHEN 5-325 MG PO TABS
1.0000 | ORAL_TABLET | ORAL | Status: DC | PRN
  Administered 2023-06-14: 1 via ORAL
  Filled 2023-06-13: qty 1

## 2023-06-13 MED ORDER — LACTATED RINGERS IV SOLN: INTRAVENOUS | Status: DC

## 2023-06-13 MED ORDER — ACETAMINOPHEN 325 MG PO TABS
650.0000 mg | ORAL_TABLET | Freq: Four times a day (QID) | ORAL | Status: DC | PRN
  Administered 2023-06-13: 650 mg via ORAL
  Filled 2023-06-13: qty 2

## 2023-06-13 MED ORDER — DEXTROSE IN LACTATED RINGERS 5 % IV SOLN: INTRAVENOUS | Status: DC

## 2023-06-13 MED ORDER — ENOXAPARIN SODIUM 60 MG/0.6ML IJ SOSY
45.0000 mg | PREFILLED_SYRINGE | INTRAMUSCULAR | Status: DC
Start: 2023-06-14 — End: 2023-06-14
  Filled 2023-06-13: qty 0.6

## 2023-06-13 MED ORDER — LACTATED RINGERS IV BOLUS
1000.0000 mL | Freq: Once | INTRAVENOUS | Status: AC
Start: 2023-06-13 — End: 2023-06-13
  Administered 2023-06-13: 1000 mL via INTRAVENOUS

## 2023-06-13 MED ORDER — POTASSIUM CHLORIDE 10 MEQ/100ML IV SOLN
10.0000 meq | INTRAVENOUS | Status: AC
  Administered 2023-06-13 (×2): 10 meq via INTRAVENOUS
  Filled 2023-06-13 (×2): qty 100

## 2023-06-13 MED ORDER — INSULIN REGULAR(HUMAN) IN NACL 100-0.9 UT/100ML-% IV SOLN: INTRAVENOUS | Status: DC

## 2023-06-13 MED ORDER — LACTATED RINGERS IV BOLUS
1000.0000 mL | Freq: Once | INTRAVENOUS | Status: AC
  Administered 2023-06-13: 1000 mL via INTRAVENOUS

## 2023-06-13 MED ORDER — ONDANSETRON HCL 4 MG/2ML IJ SOLN
4.0000 mg | Freq: Three times a day (TID) | INTRAMUSCULAR | Status: DC | PRN
  Administered 2023-06-13 – 2023-06-14 (×2): 4 mg via INTRAVENOUS
  Filled 2023-06-13 (×2): qty 2

## 2023-06-13 MED ORDER — INSULIN REGULAR(HUMAN) IN NACL 100-0.9 UT/100ML-% IV SOLN
INTRAVENOUS | Status: DC
  Administered 2023-06-13: 5.5 [IU]/h via INTRAVENOUS
  Filled 2023-06-13: qty 100

## 2023-06-13 NOTE — ED Triage Notes (Signed)
 Pt here from home with c/o hyperglycemia , ems gave 150 of fluids and 4mg  of zofran , cbg came down to 268, pt states that she has been taking her meds

## 2023-06-13 NOTE — H&P (Addendum)
 History and Physical    Cheryl Avila FMW:[id number] DOB: November 15, 1995 DOA: 06/13/2023  Referring MD/NP/PA:   PCP: [facility]   Patient coming from:  The patient is coming from home.     Chief Complaint: Polydipsia, polyuria, elevated blood sugar  HPI: Cheryl Avila is a 28 y.o. female with medical history significant of type 1 diabetes, GERD, who presents with polyuria, polydipsia, elevated blood sugar.  Patient states that she lost insurance,  and is not able to get her long-lasting insulin  Lantus  refilled.  She is only using NovoLog .  Her blood sugar is elevated.  She developed polyuria, polydipsia, but no dysuria or burning on urination.  She has generalized weakness and fatigue.  She had some chest discomfort earlier, which has resolved.  She has mild shortness of breath, no cough, fever or chills.  She has nausea, vomited few times with nonbilious nonbloody vomiting.  No diarrhea or abdominal pain.    Data reviewed independently and ED Course: pt was found to have DKA (blood sugar 263, anion gap 22, bicarbonate 9, pH 7.13 on VBG, ketone 80 by UA), WBC 14.6, AKI with creatinine 1.31, BUN 18, GFR 57 (recent baseline creatinine 0.80), WBC 14.6, negative pregnancy test, temperature normal, blood pressure 101/72, heart rate 122, RR 13, RR 24, oxygen sat 99% on room air.  Patient is admitted to stepdown as inpatient.   EKG: Not done in ED, will get one.     Review of Systems:   General: no fevers, chills, no body weight gain, has poor appetite, has fatigue HEENT: no blurry vision, hearing changes or sore throat Respiratory: had mild dyspnea, no coughing, wheezing CV: no chest pain, no palpitations GI: has nausea, vomiting, no abdominal pain, diarrhea, constipation GU: no dysuria, burning on urination, has increased urinary frequency, no hematuria  Ext: no leg edema Neuro: no unilateral weakness, numbness, or tingling, no vision change or hearing loss Skin: no  rash, no skin tear. MSK: No muscle spasm, no deformity, no limitation of range of movement in spin Heme: No easy bruising.  Travel history: No recent long distant travel.   Allergy:  Allergies  Allergen Reactions   Aleve  [Naproxen ] Hives    Past Medical History:  Diagnosis Date   Diabetes mellitus without complication (HCC)     Past Surgical History:  Procedure Laterality Date   CESAREAN SECTION     NO PAST SURGERIES      Social History:  reports that she has never smoked. She has never used smokeless tobacco. She reports that she does not drink alcohol and does not use drugs.  Family History:  Family History  Problem Relation Age of Onset   Diabetes Maternal Grandmother      Prior to Admission medications   Medication Sig Start Date End Date Taking? Authorizing Provider  acetaminophen  (TYLENOL ) 325 MG tablet Take 2 tablets (650 mg total) by mouth every 4 (four) hours as needed (for pain scale < 4  OR  temperature  >/=  100.5 F). Patient not taking: Reported on 04/28/2021 04/26/21   Dickerson, Felicia, CNM  famotidine  (PEPCID ) 20 MG tablet Take 1 tablet (20 mg total) by mouth every 12 (twelve) hours. 05/15/21   McVey, Asberry LABOR, CNM  folic acid  (FOLVITE ) 1 MG tablet Take 1 mg by mouth daily.    [provider]  gabapentin  (NEURONTIN ) 300 MG capsule Take 1 capsule (300 mg total) by mouth 2 (two) times daily as needed for up to 7 days.  12/26/22 01/02/23  Levander Slate, MD  hydrOXYzine  (ATARAX ) 50 MG tablet Take 1 tablet (50 mg total) by mouth every 6 (six) hours as needed for nausea or vomiting. 05/15/21   McVey, Asberry LABOR, CNM  insulin  aspart (NOVOLOG  FLEXPEN) 100 UNIT/ML FlexPen Novolog  1 unit for every 20 mg/dL > 899 mg/dL TID ac, hs and 2 am; Novolog  1 units for every 8 grams of carbohydrate when eating 05/15/21   McVey, Asberry A, CNM  insulin  detemir (LEVEMIR  FLEXTOUCH) 100 UNIT/ML FlexPen Inject 15 Units into the skin 2 (two) times daily. 05/15/21   McVey, Asberry LABOR, CNM   Insulin  Pen Needle (PEN NEEDLES 3/16) 31G X 5 MM MISC Use as directed 05/15/21   McVey, Asberry LABOR, CNM  ketorolac  (TORADOL ) 10 MG tablet Take 1 tablet (10 mg total) by mouth every 6 (six) hours as needed. 12/20/22   Fisher, Devere ORN, PA-C  metoCLOPramide  (REGLAN ) 10 MG tablet Take 1 tablet (10 mg total) by mouth every 6 (six) hours. 05/15/21   McVey, Asberry LABOR, CNM  ondansetron  (ZOFRAN ) 4 MG tablet Take 1 tablet (4 mg total) by mouth every 6 (six) hours. 05/15/21   McVey, Asberry LABOR, CNM  ondansetron  (ZOFRAN -ODT) 4 MG disintegrating tablet Take 1 tablet (4 mg total) by mouth every 8 (eight) hours as needed for nausea or vomiting. 03/16/22   Willo Dunnings, MD  ondansetron  (ZOFRAN -ODT) 4 MG disintegrating tablet Take 1 tablet (4 mg total) by mouth every 8 (eight) hours as needed for nausea or vomiting. 12/23/22   Lang Dover, MD  oxyCODONE -acetaminophen  (PERCOCET) 5-325 MG tablet Take 1 tablet by mouth every 4 (four) hours as needed for severe pain. 12/23/22 12/23/23  Lang Dover, MD  Prenatal 28-0.8 MG TABS Take 1 tablet by mouth daily.    [provider]  promethazine  (PHENERGAN ) 12.5 MG tablet Take 1-2 tablets (12.5-25 mg total) by mouth every 4 (four) hours as needed for nausea or vomiting. 05/15/21   McVey, Asberry LABOR, CNM  promethazine  (PHENERGAN ) 25 MG tablet Take 1 tablet (25 mg total) by mouth every 6 (six) hours as needed for nausea or vomiting. 12/26/22   Levander Slate, MD  traMADol  (ULTRAM ) 50 MG tablet Take 1 tablet (50 mg total) by mouth every 6 (six) hours as needed. 12/20/22   Gasper Devere ORN, PA-C    Physical Exam: Vitals:   06/13/23 1701 06/13/23 1716 06/13/23 1749  BP: 104/78 101/72   Pulse: (!) 122 (!) 107   Resp: (!) 24 15   Temp: 97.9 F (36.6 C)    TempSrc: Oral    SpO2: 100% 100% 99%   General: Not in acute distress.  Dry mucous membrane HEENT:       Eyes: PERRL, EOMI, no jaundice       ENT: No discharge from the ears and nose, no pharynx injection, no  tonsillar enlargement.        Neck: No JVD, no bruit, no mass felt. Heme: No neck lymph node enlargement. Cardiac: S1/S2, RRR, No murmurs, No gallops or rubs. Respiratory: No rales, wheezing, rhonchi or rubs. GI: Soft, nondistended, nontender, no rebound pain, no organomegaly, BS present. GU: No hematuria Ext: No pitting leg edema bilaterally. 1+DP/PT pulse bilaterally. Musculoskeletal: No joint deformities, No joint redness or warmth, no limitation of ROM in spin. Skin: No rashes.  Neuro: Alert, oriented X3, cranial nerves II-XII grossly intact, moves all extremities normally.   Psych: Patient is not psychotic, no suicidal or hemocidal ideation.  Labs on Admission: I  have personally reviewed following labs and imaging studies  CBC: Recent Labs  Lab 06/13/23 1705  WBC 14.6*  NEUTROABS 11.7*  HGB 16.8*  HCT 51.0*  MCV 96.8  PLT 466*   Basic Metabolic Panel: Recent Labs  Lab 06/13/23 1705 06/13/23 1946  NA 138 134*  K 4.7 4.6  CL 107 106  CO2 9* 13*  GLUCOSE 255* 214*  BUN 18 18  CREATININE 1.31* 1.17*  CALCIUM 8.9 8.5*  MG 2.4  --    GFR: CrCl cannot be calculated (Unknown ideal weight.). Liver Function Tests: Recent Labs  Lab 06/13/23 1705  AST 17  ALT 39  ALKPHOS 89  BILITOT 1.4*  PROT 8.1  ALBUMIN 4.5   No results for input(s): LIPASE, AMYLASE in the last 168 hours. No results for input(s): AMMONIA in the last 168 hours. Coagulation Profile: No results for input(s): INR, PROTIME in the last 168 hours. Cardiac Enzymes: No results for input(s): CKTOTAL, CKMB, CKMBINDEX, TROPONINI in the last 168 hours. BNP (last 3 results) No results for input(s): PROBNP in the last 8760 hours. HbA1C: No results for input(s): HGBA1C in the last 72 hours. CBG: Recent Labs  Lab 06/13/23 1725 06/13/23 1913 06/13/23 2016  GLUCAP 263* 214* 170*   Lipid Profile: No results for input(s): CHOL, HDL, LDLCALC, TRIG, CHOLHDL, LDLDIRECT  in the last 72 hours. Thyroid Function Tests: No results for input(s): TSH, T4TOTAL, FREET4, T3FREE, THYROIDAB in the last 72 hours. Anemia Panel: No results for input(s): VITAMINB12, FOLATE, FERRITIN, TIBC, IRON, RETICCTPCT in the last 72 hours. Urine analysis:    Component Value Date/Time   COLORURINE YELLOW (A) 06/13/2023 1734   APPEARANCEUR CLEAR (A) 06/13/2023 1734   APPEARANCEUR CLEAR 09/07/2014 1832   LABSPEC 1.023 06/13/2023 1734   LABSPEC 1.015 09/07/2014 1832   PHURINE 5.0 06/13/2023 1734   GLUCOSEU >=500 (A) 06/13/2023 1734   GLUCOSEU NEGATIVE 09/07/2014 1832   HGBUR SMALL (A) 06/13/2023 1734   BILIRUBINUR NEGATIVE 06/13/2023 1734   BILIRUBINUR NEGATIVE 09/07/2014 1832   KETONESUR 80 (A) 06/13/2023 1734   PROTEINUR 30 (A) 06/13/2023 1734   NITRITE NEGATIVE 06/13/2023 1734   LEUKOCYTESUR NEGATIVE 06/13/2023 1734   LEUKOCYTESUR NEGATIVE 09/07/2014 1832   Sepsis Labs: @LABRCNTIP (procalcitonin:4,lacticidven:4) )No results found for this or any previous visit (from the past 240 hours).   Radiological Exams on Admission: No results found.    Assessment/Plan Principal Problem:   DKA (diabetic ketoacidosis) (HCC) Active Problems:   Type 1 diabetes mellitus with hyperglycemia (HCC)   Leukocytosis   AKI (acute kidney injury) (HCC)   Assessment and Plan:  DKA (diabetic ketoacidosis) (HCC): Blood sugar 263, anion gap 22, bicarbonate 9, pH 7.13 on VBG, ketone 80 by UA.  Pending beta hydroxybutyric acid level.  Mental status normal.  - Admit to stepdown as inpt - IVF:  3L of LR bolus - start insulin  drip with BMP q4h - IVF: LR at 125 cc/h, will switch to D5-LR at 125 cc/h when CBG<250 - replete K as needed - Zofran  prn nausea  - NPO  - consult to diabetic educator -consult TOC for recent medication refill  Type 1 diabetes mellitus with hyperglycemia: Recent A1c 9.0 on 12/19/2022, poorly controlled.  She is on Lantus  58 units daily per patient  and NovoLog .  Patient lost insurance.  Cannot get Lantus  refilled. -Consult to TOC  Leukocytosis: WBC 14.6, no fever.  No source of infection identified.  Urinalysis negative for UTI.  Likely due to DKA -Follow-up with CBC  AKI (acute kidney injury) (HCC): Likely due to dehydration. -IV fluid as above -Hold ketorolac       DVT ppx: SQ Lovenox   Code Status: Full code     Family Communication: Yes, patient's mother at bed side.        Disposition Plan:  Anticipate discharge back to previous environment  Consults called: None  Admission status and Level of care: Stepdown:  as inpt    Dispo: The patient is from: Home              Anticipated d/c is to: Home              Anticipated d/c date is: 2 days              Patient currently is not medically stable to d/c.    Severity of Illness:  The appropriate patient status for this patient is INPATIENT. Inpatient status is judged to be reasonable and necessary in order to provide the required intensity of service to ensure the patient's safety. The patient's presenting symptoms, physical exam findings, and initial radiographic and laboratory data in the context of their chronic comorbidities is felt to place them at high risk for further clinical deterioration. Furthermore, it is not anticipated that the patient will be medically stable for discharge from the hospital within 2 midnights of admission.   * I certify that at the point of admission it is my clinical judgment that the patient will require inpatient hospital care spanning beyond 2 midnights from the point of admission due to high intensity of service, high risk for further deterioration and high frequency of surveillance required.*       Date of Service 06/13/2023    Caleb Exon [facility]   If 7PM-7AM, please contact night-coverage [email or web address] 06/13/2023, 8:47 PM

## 2023-06-13 NOTE — ED Provider Notes (Signed)
 [facility] Provider Note    Event Date/Time   First MD Initiated Contact with Patient 06/13/23 1713     (approximate)   History   Chief Complaint Hyperglycemia   HPI  Norman Shanik Brookshire is a 28 y.o. female with past medical history of diabetes who presents to the ED complaining of hyperglycemia.  Patient reports that her blood sugar has been running high over the past couple of days, but does not remember what her last reading was.  She states she has been taking her NovoLog  as prescribed, however she recently lost insurance and has been unable to get her Lantus .  She reports feeling nauseous with multiple episodes of vomiting, denies any diarrhea.  She denies any fevers dysuria, flank pain, or abdominal pain.     Physical Exam   Triage Vital Signs: ED Triage Vitals [06/13/23 1701]  Encounter Vitals Group     BP 104/78     Systolic BP Percentile      Diastolic BP Percentile      Pulse Rate (!) 122     Resp (!) 24     Temp 97.9 F (36.6 C)     Temp Source Oral     SpO2 100 %     Weight      Height      Head Circumference      Peak Flow      Pain Score 8     Pain Loc      Pain Education      Exclude from Growth Chart     Most recent vital signs: Vitals:   06/13/23 1716 06/13/23 1749  BP: 101/72   Pulse: (!) 107   Resp: 15   Temp:    SpO2: 100% 99%    Constitutional: Alert and oriented. Eyes: Conjunctivae are normal. Head: Atraumatic. Nose: No congestion/rhinnorhea. Mouth/Throat: Mucous membranes are dry Cardiovascular: Tachycardic, regular rhythm. Grossly normal heart sounds.  2+ radial pulses bilaterally. Respiratory: Mildly tachypneic with normal respiratory effort.  No retractions. Lungs CTAB. Gastrointestinal: Soft and nontender. No distention. Musculoskeletal: No lower extremity tenderness nor edema.  Neurologic:  Normal speech and language. No gross focal neurologic deficits are appreciated.    ED Results / Procedures  / Treatments   Labs (all labs ordered are listed, but only abnormal results are displayed) Labs Reviewed  CBC WITH DIFFERENTIAL/PLATELET - Abnormal; Notable for the following components:      Result Value   WBC 14.6 (*)    RBC 5.27 (*)    Hemoglobin 16.8 (*)    HCT 51.0 (*)    Platelets 466 (*)    Neutro Abs 11.7 (*)    Abs Immature Granulocytes 0.08 (*)    All other components within normal limits  COMPREHENSIVE METABOLIC PANEL - Abnormal; Notable for the following components:   CO2 9 (*)    Glucose, Bld 255 (*)    Creatinine, Ser 1.31 (*)    Total Bilirubin 1.4 (*)    GFR, Estimated 57 (*)    Anion gap 22 (*)    All other components within normal limits  URINALYSIS, ROUTINE W REFLEX MICROSCOPIC - Abnormal; Notable for the following components:   Color, Urine YELLOW (*)    APPearance CLEAR (*)    Glucose, UA >=500 (*)    Hgb urine dipstick SMALL (*)    Ketones, ur 80 (*)    Protein, ur 30 (*)    Bacteria, UA RARE (*)  All other components within normal limits  BLOOD GAS, VENOUS - Abnormal; Notable for the following components:   pH, Ven 7.13 (*)    pCO2, Ven 34 (*)    pO2, Ven <31 (*)    Bicarbonate 11.3 (*)    Acid-base deficit 16.9 (*)    All other components within normal limits  CBG MONITORING, ED - Abnormal; Notable for the following components:   Glucose-Capillary 263 (*)    All other components within normal limits  MAGNESIUM  HCG, QUANTITATIVE, PREGNANCY  PREGNANCY, URINE  BETA-HYDROXYBUTYRIC ACID  CBG MONITORING, ED    PROCEDURES:  Critical Care performed: Yes, see critical care procedure note(s)  .Critical Care  Performed by: Willo Dunnings, MD Authorized by: Willo Dunnings, MD   Critical care provider statement:    Critical care time (minutes):  30   Critical care time was exclusive of:  Separately billable procedures and treating other patients and teaching time   Critical care was necessary to treat or prevent imminent or life-threatening  deterioration of the following conditions:  Endocrine crisis and metabolic crisis   Critical care was time spent personally by me on the following activities:  Development of treatment plan with patient or surrogate, discussions with consultants, evaluation of patient's response to treatment, examination of patient, ordering and review of laboratory studies, ordering and review of radiographic studies, ordering and performing treatments and interventions, pulse oximetry, re-evaluation of patient's condition and review of old charts   I assumed direction of critical care for this patient from another provider in my specialty: no     Care discussed with: admitting provider      MEDICATIONS ORDERED IN ED: Medications  insulin  regular, human (MYXREDLIN ) 100 units/ 100 mL infusion (has no administration in time range)  dextrose  50 % solution 0-50 mL (has no administration in time range)  potassium chloride  10 mEq in 100 mL IVPB (10 mEq Intravenous New Bag/Given 06/13/23 1844)  lactated ringers  infusion (0 mLs Intravenous Hold 06/13/23 1844)  dextrose  5 % in lactated ringers  infusion (has no administration in time range)  lactated ringers  bolus 1,000 mL (1,000 mLs Intravenous New Bag/Given 06/13/23 1734)  lactated ringers  bolus 1,000 mL (1,000 mLs Intravenous New Bag/Given 06/13/23 1841)     IMPRESSION / MDM / ASSESSMENT AND PLAN / ED COURSE  I reviewed the triage vital signs and the nursing notes.                              28 y.o. female with past medical history of type 1 diabetes who presents to the ED with hyperglycemia, nausea, and vomiting for the past couple of days.  Patient's presentation is most consistent with acute presentation with potential threat to life or bodily function.  Differential diagnosis includes, but is not limited to, DKA, hyperglycemia, dehydration, electrolyte abnormality, AKI, medication noncompliance, UTI.  Patient ill-appearing but in no acute distress, vital signs  remarkable for tachycardia and mild tachypnea.  She appears clinically dehydrated and I am concerned for possible DKA, we will initiate IV fluid bolus.  Labs show leukocytosis and what appears to be hemoconcentration, patient also with AKI and DKA given increased anion gap and low bicarb.  Plan to give additional IV fluids and initiate insulin  drip, VBG and beta hydroxybutyrate are pending at this time.  VBG with pH of 7.13, patient does appear clinically more alert on reassessment following IV fluid bolus, insulin  drip starting now.  Urinalysis with no signs of infection, case discussed with hospitalist for admission.      FINAL CLINICAL IMPRESSION(S) / ED DIAGNOSES   Final diagnoses:  Diabetic ketoacidosis without coma associated with type 1 diabetes mellitus (HCC)  AKI (acute kidney injury) (HCC)     Rx / DC Orders   ED Discharge Orders     None        Note:  This document was prepared using Dragon voice recognition software and may include unintentional dictation errors.   Willo Dunnings, MD 06/13/23 1910

## 2023-06-13 NOTE — ED Notes (Signed)
 Date and time results received: 06/13/23 1835  Test: pH Critical Value: 7.13  Name of Provider Notified: MD Larinda Buttery

## 2023-06-13 NOTE — Progress Notes (Signed)
 PHARMACIST - PHYSICIAN COMMUNICATION  CONCERNING:  Enoxaparin  (Lovenox ) for DVT Prophylaxis    RECOMMENDATION: Patient was prescribed enoxaprin 40mg  q24 hours for VTE prophylaxis.   Filed Weights   06/13/23 2155  Weight: 93.5 kg (206 lb 1.6 oz)    Body mass index is 40.25 kg/m.  Estimated Creatinine Clearance: 73.8 mL/min (A) (by C-G formula based on SCr of 1.17 mg/dL (H)).   Based on [facility] policy patient is candidate for enoxaparin  0.5mg /kg TBW SQ every 24 hours based on BMI being >30.  DESCRIPTION: Pharmacy has adjusted enoxaparin  dose per [facility] policy.  Patient is now receiving enoxaparin  0.5 mg/kg every 24 hours   Rankin CANDIE Dills, PharmD, [facility] 06/13/2023 9:56 PM

## 2023-06-13 NOTE — ED Triage Notes (Signed)
 Pt here via [facility] for hyperglycemia. Pt reports polydipsia, polyuria, and n/v. Pt says she takes her insulin "but it doesn't do anything. My blood sugar is always high." Pt with kussmaul respirations in triage.

## 2023-06-14 ENCOUNTER — Other Ambulatory Visit: Payer: Self-pay

## 2023-06-14 ENCOUNTER — Encounter: Payer: Self-pay | Admitting: Internal Medicine

## 2023-06-14 DIAGNOSIS — E669 Obesity, unspecified: Secondary | ICD-10-CM

## 2023-06-14 DIAGNOSIS — E871 Hypo-osmolality and hyponatremia: Secondary | ICD-10-CM

## 2023-06-14 LAB — BLOOD GAS, VENOUS
Acid-base deficit: 7.9 mmol/L — ABNORMAL HIGH (ref 0.0–2.0)
Bicarbonate: 16.9 mmol/L — ABNORMAL LOW (ref 20.0–28.0)
O2 Saturation: 85.2 %
Patient temperature: 37
pCO2, Ven: 32 mm[Hg] — ABNORMAL LOW (ref 44–60)
pH, Ven: 7.33 (ref 7.25–7.43)
pO2, Ven: 47 mm[Hg] — ABNORMAL HIGH (ref 32–45)

## 2023-06-14 LAB — GLUCOSE, CAPILLARY
Glucose-Capillary: 121 mg/dL — ABNORMAL HIGH (ref 70–99)
Glucose-Capillary: 122 mg/dL — ABNORMAL HIGH (ref 70–99)
Glucose-Capillary: 123 mg/dL — ABNORMAL HIGH (ref 70–99)
Glucose-Capillary: 134 mg/dL — ABNORMAL HIGH (ref 70–99)
Glucose-Capillary: 141 mg/dL — ABNORMAL HIGH (ref 70–99)
Glucose-Capillary: 141 mg/dL — ABNORMAL HIGH (ref 70–99)
Glucose-Capillary: 144 mg/dL — ABNORMAL HIGH (ref 70–99)
Glucose-Capillary: 145 mg/dL — ABNORMAL HIGH (ref 70–99)
Glucose-Capillary: 153 mg/dL — ABNORMAL HIGH (ref 70–99)
Glucose-Capillary: 162 mg/dL — ABNORMAL HIGH (ref 70–99)
Glucose-Capillary: 164 mg/dL — ABNORMAL HIGH (ref 70–99)
Glucose-Capillary: 172 mg/dL — ABNORMAL HIGH (ref 70–99)
Glucose-Capillary: 174 mg/dL — ABNORMAL HIGH (ref 70–99)
Glucose-Capillary: 195 mg/dL — ABNORMAL HIGH (ref 70–99)
Glucose-Capillary: 204 mg/dL — ABNORMAL HIGH (ref 70–99)
Glucose-Capillary: 272 mg/dL — ABNORMAL HIGH (ref 70–99)

## 2023-06-14 LAB — CBC
HCT: 37.3 % (ref 36.0–46.0)
Hemoglobin: 12.9 g/dL (ref 12.0–15.0)
MCH: 31.6 pg (ref 26.0–34.0)
MCHC: 34.6 g/dL (ref 30.0–36.0)
MCV: 91.4 fL (ref 80.0–100.0)
Platelets: 296 10*3/uL (ref 150–400)
RBC: 4.08 MIL/uL (ref 3.87–5.11)
RDW: 12.4 % (ref 11.5–15.5)
WBC: 7.4 10*3/uL (ref 4.0–10.5)
nRBC: 0 % (ref 0.0–0.2)

## 2023-06-14 LAB — BASIC METABOLIC PANEL
Anion gap: 10 (ref 5–15)
Anion gap: 7 (ref 5–15)
Anion gap: 12 (ref 5–15)
BUN: 12 mg/dL (ref 6–20)
BUN: 11 mg/dL (ref 6–20)
BUN: 10 mg/dL (ref 6–20)
CO2: 15 mmol/L — ABNORMAL LOW (ref 22–32)
CO2: 18 mmol/L — ABNORMAL LOW (ref 22–32)
CO2: 17 mmol/L — ABNORMAL LOW (ref 22–32)
Calcium: 8.1 mg/dL — ABNORMAL LOW (ref 8.9–10.3)
Calcium: 8 mg/dL — ABNORMAL LOW (ref 8.9–10.3)
Calcium: 8.3 mg/dL — ABNORMAL LOW (ref 8.9–10.3)
Chloride: 109 mmol/L (ref 98–111)
Chloride: 109 mmol/L (ref 98–111)
Chloride: 105 mmol/L (ref 98–111)
Creatinine, Ser: 0.72 mg/dL (ref 0.44–1.00)
Creatinine, Ser: 0.69 mg/dL (ref 0.44–1.00)
Creatinine, Ser: 0.8 mg/dL (ref 0.44–1.00)
GFR, Estimated: >60 mL/min (ref >60)
GFR, Estimated: >60 mL/min (ref >60)
GFR, Estimated: >60 mL/min (ref >60)
Glucose, Bld: 121 mg/dL — ABNORMAL HIGH (ref 70–99)
Glucose, Bld: 140 mg/dL — ABNORMAL HIGH (ref 70–99)
Glucose, Bld: 193 mg/dL — ABNORMAL HIGH (ref 70–99)
Potassium: 3.5 mmol/L (ref 3.5–5.1)
Potassium: 3.6 mmol/L (ref 3.5–5.1)
Potassium: 3.7 mmol/L (ref 3.5–5.1)
Sodium: 134 mmol/L — ABNORMAL LOW (ref 135–145)
Sodium: 134 mmol/L — ABNORMAL LOW (ref 135–145)
Sodium: 134 mmol/L — ABNORMAL LOW (ref 135–145)

## 2023-06-14 LAB — HIV ANTIBODY (ROUTINE TESTING W REFLEX): HIV Screen 4th Generation wRfx: NONREACTIVE

## 2023-06-14 LAB — BETA-HYDROXYBUTYRIC ACID: Beta-Hydroxybutyric Acid: 0.63 mmol/L — ABNORMAL HIGH (ref 0.05–0.27)

## 2023-06-14 LAB — MRSA NEXT GEN BY PCR, NASAL: MRSA by PCR Next Gen: NOT DETECTED

## 2023-06-14 MED ORDER — INSULIN ASPART 100 UNIT/ML IJ SOLN: 0.0000 [IU] | Freq: Every day | INTRAMUSCULAR | Status: DC

## 2023-06-14 MED ORDER — ONDANSETRON HCL 4 MG PO TABS
4.0000 mg | ORAL_TABLET | Freq: Three times a day (TID) | ORAL | 0 refills | Status: DC | PRN
  Filled 2023-06-14: qty 20, 7d supply, fill #0

## 2023-06-14 MED ORDER — INSULIN ASPART 100 UNIT/ML IJ SOLN
6.0000 [IU] | Freq: Three times a day (TID) | INTRAMUSCULAR | Status: DC
Start: 2023-06-14 — End: 2023-06-14
  Administered 2023-06-14: 6 [IU] via SUBCUTANEOUS
  Filled 2023-06-14: qty 1

## 2023-06-14 MED ORDER — INSULIN LISPRO (1 UNIT DIAL) 100 UNIT/ML (KWIKPEN)
PEN_INJECTOR | SUBCUTANEOUS | 0 refills | Status: DC
  Filled 2023-06-14: qty 15, 30d supply, fill #0

## 2023-06-14 MED ORDER — INSULIN GLARGINE-YFGN 100 UNIT/ML ~~LOC~~ SOLN
35.0000 [IU] | Freq: Every day | SUBCUTANEOUS | Status: DC
  Administered 2023-06-14: 35 [IU] via SUBCUTANEOUS
  Filled 2023-06-14: qty 0.35

## 2023-06-14 MED ORDER — POTASSIUM CHLORIDE CRYS ER 20 MEQ PO TBCR
40.0000 meq | EXTENDED_RELEASE_TABLET | Freq: Once | ORAL | Status: DC
  Filled 2023-06-14: qty 2

## 2023-06-14 MED ORDER — BASAGLAR KWIKPEN 100 UNIT/ML ~~LOC~~ SOPN
35.0000 [IU] | PEN_INJECTOR | Freq: Every day | SUBCUTANEOUS | 0 refills | Status: DC
  Filled 2023-06-14: qty 15, 42d supply, fill #0

## 2023-06-14 MED ORDER — LANCET DEVICE MISC
1.0000 | Freq: Three times a day (TID) | 0 refills | Status: AC
  Filled 2023-06-14: qty 1, 30d supply, fill #0

## 2023-06-14 MED ORDER — BLOOD GLUCOSE MONITOR SYSTEM W/DEVICE KIT
1.0000 | PACK | Freq: Three times a day (TID) | 0 refills | Status: AC
  Filled 2023-06-14: qty 1, 1d supply, fill #0

## 2023-06-14 MED ORDER — INSULIN ASPART 100 UNIT/ML IJ SOLN
0.0000 [IU] | Freq: Three times a day (TID) | INTRAMUSCULAR | Status: DC
  Administered 2023-06-14: 1 [IU] via SUBCUTANEOUS
  Filled 2023-06-14: qty 1

## 2023-06-14 MED ORDER — INSULIN PEN NEEDLE 31G X 5 MM MISC
1.0000 | Freq: Three times a day (TID) | 0 refills | Status: DC
  Filled 2023-06-14: qty 200, 50d supply, fill #0

## 2023-06-14 MED ORDER — ORAL CARE MOUTH RINSE: 15.0000 mL | OROMUCOSAL | Status: DC | PRN

## 2023-06-14 MED ORDER — BLOOD GLUCOSE TEST VI STRP
1.0000 | ORAL_STRIP | Freq: Three times a day (TID) | 0 refills | Status: DC
  Filled 2023-06-14: qty 200, 67d supply, fill #0

## 2023-06-14 MED ORDER — POTASSIUM CHLORIDE 20 MEQ PO PACK
40.0000 meq | PACK | Freq: Once | ORAL | Status: AC
  Administered 2023-06-14: 40 meq via ORAL
  Filled 2023-06-14: qty 2

## 2023-06-14 MED ORDER — TRUEPLUS LANCETS 33G MISC
1.0000 | Freq: Three times a day (TID) | 0 refills | Status: DC
  Filled 2023-06-14: qty 200, 30d supply, fill #0

## 2023-06-14 MED ORDER — CHLORHEXIDINE GLUCONATE CLOTH 2 % EX PADS
6.0000 | MEDICATED_PAD | Freq: Every day | CUTANEOUS | Status: DC
  Administered 2023-06-14: 6 via TOPICAL
  Filled 2023-06-14: qty 6

## 2023-06-14 NOTE — Progress Notes (Signed)
 Patient discharged home. Mother at bedside to drive patient home. Patient received all diabetic supplies and insulin  prior to discharge. Secured in mother's care. All questions answered for patient prior to discharge. Vitals WDL at time of discharge. All personal belongings identified by patient and taken with patient upon leaving.

## 2023-06-14 NOTE — Plan of Care (Signed)
 Problem: Education: Goal: Ability to describe self-care measures that may prevent or decrease complications (Diabetes Survival Skills Education) will improve Outcome: Progressing Goal: Individualized Educational Video(s) Outcome: Progressing   Problem: Coping: Goal: Ability to adjust to condition or change in health will improve Outcome: Progressing   Problem: Fluid Volume: Goal: Ability to maintain a balanced intake and output will improve Outcome: Progressing   Problem: Health Behavior/Discharge Planning: Goal: Ability to identify and utilize available resources and services will improve Outcome: Progressing Goal: Ability to manage health-related needs will improve Outcome: Progressing   Problem: Metabolic: Goal: Ability to maintain appropriate glucose levels will improve Outcome: Progressing   Problem: Nutritional: Goal: Maintenance of adequate nutrition will improve Outcome: Progressing Goal: Progress toward achieving an optimal weight will improve Outcome: Progressing   Problem: Skin Integrity: Goal: Risk for impaired skin integrity will decrease Outcome: Progressing   Problem: Tissue Perfusion: Goal: Adequacy of tissue perfusion will improve Outcome: Progressing   Problem: Education: Goal: Ability to describe self-care measures that may prevent or decrease complications (Diabetes Survival Skills Education) will improve Outcome: Progressing Goal: Individualized Educational Video(s) Outcome: Progressing   Problem: Cardiac: Goal: Ability to maintain an adequate cardiac output will improve Outcome: Progressing   Problem: Health Behavior/Discharge Planning: Goal: Ability to identify and utilize available resources and services will improve Outcome: Progressing Goal: Ability to manage health-related needs will improve Outcome: Progressing   Problem: Fluid Volume: Goal: Ability to achieve a balanced intake and output will improve Outcome: Progressing    Problem: Metabolic: Goal: Ability to maintain appropriate glucose levels will improve Outcome: Progressing   Problem: Nutritional: Goal: Maintenance of adequate nutrition will improve Outcome: Progressing Goal: Maintenance of adequate weight for body size and type will improve Outcome: Progressing   Problem: Respiratory: Goal: Will regain and/or maintain adequate ventilation Outcome: Progressing   Problem: Urinary Elimination: Goal: Ability to achieve and maintain adequate renal perfusion and functioning will improve Outcome: Progressing   Problem: Education: Goal: Knowledge of General Education information will improve Description: Including pain rating scale, medication(s)/side effects and non-pharmacologic comfort measures Outcome: Progressing   Problem: Health Behavior/Discharge Planning: Goal: Ability to manage health-related needs will improve Outcome: Progressing   Problem: Clinical Measurements: Goal: Ability to maintain clinical measurements within normal limits will improve Outcome: Progressing Goal: Will remain free from infection Outcome: Progressing Goal: Diagnostic test results will improve Outcome: Progressing Goal: Respiratory complications will improve Outcome: Progressing Goal: Cardiovascular complication will be avoided Outcome: Progressing   Problem: Activity: Goal: Risk for activity intolerance will decrease Outcome: Progressing   Problem: Nutrition: Goal: Adequate nutrition will be maintained Outcome: Progressing   Problem: Coping: Goal: Level of anxiety will decrease Outcome: Progressing   Problem: Elimination: Goal: Will not experience complications related to bowel motility Outcome: Progressing Goal: Will not experience complications related to urinary retention Outcome: Progressing   Problem: Pain Management: Goal: General experience of comfort will improve Outcome: Progressing   Problem: Safety: Goal: Ability to remain free  from injury will improve Outcome: Progressing   Problem: Skin Integrity: Goal: Risk for impaired skin integrity will decrease Outcome: Progressing   Pt admitted from ED with DKA. Pt A&O x4, though c/o headached unrelieved by PRN Tylenol . PRN Percocet administered per orders/EMAR. Pt able to stand and pivot from ED stretcher to hospital bed without difficulties. Pt resting comfortably in bed with mother at bedside. Pt remains on D5LR and Insulin  drips per EMAR. Admitted to cardiac monitors with stable vital signs. Bed in lowest position with bed alarms  on d/t administration of Percocet, and call bell within reach. Pt and pt's mother educated on POC. No additional questions at this time.

## 2023-06-14 NOTE — Discharge Instructions (Addendum)
 Take basaglar  insulin  35 units subcutaneous injection daily.  If sugars remain all over 200, can go up on the basaglar  3 units every 3 day days.  Check your sugars 4 times a day and anytime you feel funny.  Continue with humolog short acting insulin  before meals plus sliding scale.    Rent/Utility/Housing  Agency Name: [facility] Address: [address] [place], [place] [address] Phone: [phone] Email: [email or web address] Website: [email or web address] Service(s) Offered: Housing services, self-sufficiency, congregate meal program, weatherization program, field seismologist program, emergency food assistance,  housing counseling, home ownership program, wheels -towork program.  Agency Name: [facility] Address: [address], [place], [place] [address] Phone: [phone] (8a-4p) [phone] (8p- 10p) Email: [email or web address] Website: [email or web address] Service(s) Offered: A program for homeless and/or needy men that includes one-on-one counseling, life skills training and job rehabilitation.  Agency Name: [facility] Address: [address], [place], [place] [address] Phone: [phone] Website: www.[email or web address].org Service(s) Offered: Assistance to needy in emergency with utility bills, heating fuel, and prescriptions. Shelter for homeless 7pm-7am. October 05, 2016 15  Agency Name: [facility] [place] [facility]Developmentally Disabled) Address: [address], [place], [place] [address] Phone: [phone] Contact Person: Lemond Cart Email: [email or web address] Website: [email or web address] Service(s) Offered: Helps individuals with developmental disabilities move from housing that is more restrictive to homes where they  can achieve greater independence and have more  opportunities.  Agency Name: [facility] Address: [address], [place],  [place] [address] Phone: [phone] Email: [email or web address] Website: [email or web address].org Service(s) Offered: Provides affordable housing for low-income families, elderly, and disabled individuals. Offer a wide range of  programs and services, from financial planning to afterschool and summer programs.  Agency Name: [facility] Address: [address] [place], [place] [address] Phone: [phone] Service(s) Offered: Child support services; child welfare services; food stamps; Medicaid; work first family assistance; and aid with fuel,  rent, food and medicine.  Agency Name: [facility] [place] [employer] Address: [facility] [address] [place], [place]  [address] Phone: [phone] Website: www.familyabuseservices.org Service(s) Offered: 24 hour Crisis Line: 4165759227; 24 hour Emergency Shelter; Transitional Housing; Support Groups; Scientist, Physiological; [employer]; Hispanic Outreach: [phone];  Visitation Center: [phone].  Agency Name: [facility] [place]. Address: [address] [place], [place] [address] Phone: [phone] Service(s) Offered: CAP Services; Home and [employer]; Individual or Group Supports; Respite Care Non-Institutional Nursing;  Residential Supports; Respite Care and Personal Care Services; Transportation; Family and Friends Night; Recreational Activities; Three Nutritious Meals/Snacks; Consultation with Registered Dietician; Twenty-four hour Registered Nurse Access; Daily and [employer]; Camp Green Leaves; Humptulips for the [employer] (During Summer Months) Bingo Night (Every  Wednesday Night); Special Populations Dance Night  (Every Tuesday Night); Professional Hair Care Services.  Agency Name: [facility] Address: [address], [place], [place] [address] Phone: [phone] Contact Person: Meade High Website:  [email or web address].Physicist, Medical) Offered: Residential treatment facility for women; food and  clothing, educational & employment development and  transportation to work; counsellor of financial skills;  parenting and family reunification; emotional and spiritual  support; transitional housing for program graduates.  Agency Name: [facility] Address: [address], [place], [place] [address] Phone: [phone] Email: [email or web address] Website: [email or web address] Service(s) Offered: Public housing units for elderly, disabled, and low income people; housing choice vouchers for income eligible  applicants; shelter plus care vouchers; and [employer] voucher program.  Agency Name: [facility] [place] Address: [address] [place], [place] [address] Phone: [phone] Email: [email or web address] Website: www.habitatalamance.org Service(s) Offered: Build houses for families in need of decent housing. Each adult in the family must invest 200 hours of labor on  someone else's house, work with volunteers to build their own house, attend classes on budgeting, home maintenance, yard care, and attend homeowner association meetings.  Agency Name: [facility] Address: [address] [place], [place] [address] Phone: [phone] Website: www.rsli.org Service(s) Offered: Intermediate care facilities for intellectually delayed, Supervised Living in group homes for adults with developmental disabilities, Supervised Living for people who have dual diagnoses (MRMI), Independent Living, Supported Living, respite and a variety of CAP services, pre-vocational services, day supports, and [employer].  Agency Name: [place] [employer] Phone: [phone] Website: www.NCForeclosurePrevention.gov Service(s) Offered: Zero-interest, deferred loans to homeowners struggling to pay their mortgage. Call  for more information.

## 2023-06-14 NOTE — Hospital Course (Signed)
 28 year old female past medical history of type 1 diabetes, GERD presented with polyuria polydipsia and elevated blood sugar.  She lost her insurance and unable to get her long-acting Lantus  refilled.  She was found to be in diabetic ketoacidosis and started on insulin  drip.  1/2.  Patient converted off insulin  drip.  As per diabetes coordinator will give 35 units of long-acting insulin  plus sliding scale short acting insulin  +6 units.  We were able to obtain medications for medication management which includes Basaglar  insulin  long-acting insulin  and Humalog  short acting insulin .

## 2023-06-14 NOTE — Assessment & Plan Note (Signed)
 Likely from vomiting and dehydration.  Creatinine improved from 1.31 down to 0.8

## 2023-06-14 NOTE — Inpatient Diabetes Management (Addendum)
 Inpatient Diabetes Program Recommendations  AACE/ADA: New Consensus Statement on Inpatient Glycemic Control (2025)  Target Ranges:  Prepandial:   less than 140 mg/dL      Peak postprandial:   less than 180 mg/dL (1-2 hours)      Critically ill patients:  140 - 180 mg/dL   Lab Results  Component Value Date   GLUCAP 144 (H) 06/14/2023   HGBA1C 5.9 (H) 04/22/2021    Review of Glycemic Control  Latest Reference Range & Units 06/14/23 02:37 06/14/23 03:34 06/14/23 04:26 06/14/23 05:32 06/14/23 06:31 06/14/23 08:06 06/14/23 09:15  Glucose-Capillary 70 - 99 mg/dL 877 (H) 878 (H) 858 (H) 145 (H) 123 (H) 195 (H) 144 (H)   Diabetes history: DM 1 Outpatient Diabetes medications:  Per reports, patient only taking Novolog    Last visit with Dr. Army in July of 2024-64 units of Lantus  qam. She takes NovoLog  with her meals. She carb counts with a ratio of 1 per 5 and takes a correction of 1 per 25 over 110  Current orders for Inpatient glycemic control:  IV insulin /DKA orders  Inpatient Diabetes Program Recommendations:    Note that per H&P, patient unable to get basal insulin  due to loss of insurance.  She will need access to both basal and bolus insulins.  Will see patient today.   -For transition, recommend Semglee  35 units 2 hours prior to d/c of insulin  drip, Novolog  6 units tid with meals (hold if patient eats less than 50% or NPO) and Novolog  sensitive correction (0-9 units) tid with meals.   Addendum 1300- Spoke with patient and husband.  Per patient, she lost insurance in August of 2024 and has been trying to figure out why and what happened?  Discussed coupons available for cash paying patients and gave patient [employer] coupon which would cover Basaglar  and Humalog  (35$ a piece).  She states that she has plenty of strips for monitoring. Prior to stopping basal insulin , she states she was taking Lantus  58 units daily.  We discussed importance of having supplies/ insulins to care for  self.  She states that she thinks her A1C is probably close to 9% but in the past she has had excellent control .  Discussed with social work/TOC, and need for assistance with medicaid and f/u appointments.  Patient appreciative of visit.  Will follow.   Thanks,  Randall Bullocks, RN, BC-ADM Inpatient Diabetes Coordinator Pager [phone]  (8a-5p)

## 2023-06-14 NOTE — Plan of Care (Signed)

## 2023-06-14 NOTE — Assessment & Plan Note (Signed)
 Patient was placed on insulin  drip.  Patient converted off insulin  drip on the day of discharge.  She was feeling much better.  As per diabetes coordinator will put on long-acting insulin  35 units daily and short acting insulin  6 units plus sliding scale upon going home.  Medication management has Basaglar  insulin  as long-acting insulin  and Humalog  as the short acting insulin .  Diabetes supplies and glucometer also prescribed.  [facility].

## 2023-06-14 NOTE — Discharge Summary (Signed)
 Physician Discharge Summary   Patient: Cheryl Avila MRN: [id number] DOB: [birth year]  Admit date:     06/13/2023  Discharge date: 06/14/23  Discharge Physician: Charlie Patterson   PCP: [facility]   Recommendations at discharge:   [facility]  Discharge Diagnoses: Principal Problem:   DKA (diabetic ketoacidosis) (HCC) Active Problems:   Type 1 diabetes mellitus with hyperglycemia (HCC)   Leukocytosis   AKI (acute kidney injury) (HCC)   Hyponatremia   Obesity (BMI 30-39.9)  Resolved Problems:   * No resolved hospital problems. *  Hospital Course: 28 year old female past medical history of type 1 diabetes, GERD presented with polyuria polydipsia and elevated blood sugar.  She lost her insurance and unable to get her long-acting Lantus  refilled.  She was found to be in diabetic ketoacidosis and started on insulin  drip.  1/2.  Patient converted off insulin  drip.  As per diabetes coordinator will give 35 units of long-acting insulin  plus sliding scale short acting insulin  +6 units.  We were able to obtain medications for medication management which includes Basaglar  insulin  long-acting insulin  and Humalog  short acting insulin .  Assessment and Plan: * DKA (diabetic ketoacidosis) (HCC) Patient was placed on insulin  drip.  Patient converted off insulin  drip on the day of discharge.  She was feeling much better.  As per diabetes coordinator will put on long-acting insulin  35 units daily and short acting insulin  6 units plus sliding scale upon going home.  Medication management has Basaglar  insulin  as long-acting insulin  and Humalog  as the short acting insulin .  Diabetes supplies and glucometer also prescribed.  [facility] since she [facility] her [facility].  Leukocytosis Likely reactive from vomiting  AKI (acute kidney injury) (HCC) Likely from vomiting and dehydration.  Creatinine improved from 1.31 down to 0.8  Obesity  (BMI 30-39.9) BMI 35.18 with last height and weight in computer.         Consultants: Diabetes coordinator Procedures performed: None Disposition: Home Diet recommendation:  Carb modified diet DISCHARGE MEDICATION: Allergies as of 06/14/2023       Reactions   Aleve  [naproxen ] Hives        Medication List     STOP taking these medications    famotidine  20 MG tablet Commonly known as: PEPCID    gabapentin  300 MG capsule Commonly known as: Neurontin    NovoLOG  FlexPen 100 UNIT/ML FlexPen Generic drug: insulin  aspart   oxyCODONE -acetaminophen  5-325 MG tablet Commonly known as: Percocet       TAKE these medications    Basaglar  KwikPen 100 UNIT/ML Inject 35 Units into the skin daily.   HumaLOG  KwikPen 100 UNIT/ML KwikPen Generic drug: insulin  lispro If eating and Blood Glucose (BG) 80 or higher inject 6 units for meal coverage and add correction dose per scale. If not eating, correction dose only. BG <150= 0 unit; BG 150-200= 1 unit; BG 201-250= 3 unit; BG 251-300= 5 unit; BG 301-350= 7 unit; BG 351-400= 9 unit; BG >400= 11 unit and Call Primary Care.   Insupen Pen Needles 31G X 5 MM Misc Generic drug: Insulin  Pen Needle Use 3 (three) times daily before meals and at bedtime with lantus  What changed:  how much to take how to take this when to take this additional instructions   ondansetron  4 MG tablet Commonly known as: Zofran  Take 1 tablet (4 mg total) by mouth every 8 (eight) hours as needed for nausea or vomiting.   True Metrix Blood Glucose Test test  strip Generic drug: glucose blood Test sugars in the morning, at noon, and at bedtime.   True Metrix Meter w/Device Kit Test morning, at noon, and at bedtime.   TRUEdraw Lancing Device Misc Test blood sugars in the morning, at noon, and at bedtime.   TRUEplus Lancets 33G Misc Check blood sugars in the morning, at noon, and at bedtime.        Follow-up Information     [facility]  Follow up in 2 week(s).   Specialty: Primary Care Contact information: [address] [place]  [address] [phone]               Discharge Exam: Fredricka Weights   06/13/23 2155 06/14/23 0030  Weight: 93.5 kg 81.7 kg   Physical Exam HENT:     Head: Normocephalic.     Mouth/Throat:     Pharynx: No oropharyngeal exudate.  Eyes:     General: Lids are normal.     Conjunctiva/sclera: Conjunctivae normal.  Cardiovascular:     Rate and Rhythm: Normal rate and regular rhythm.     Heart sounds: Normal heart sounds, S1 normal and S2 normal.  Pulmonary:     Breath sounds: No decreased breath sounds, wheezing, rhonchi or rales.  Abdominal:     Palpations: Abdomen is soft.     Tenderness: There is no abdominal tenderness.  Musculoskeletal:     Right lower leg: No swelling.     Left lower leg: No swelling.  Skin:    General: Skin is warm.     Findings: No rash.  Neurological:     Mental Status: She is alert and oriented to person, place, and time.      Condition at discharge: stable  The results of significant diagnostics from this hospitalization (including imaging, microbiology, ancillary and laboratory) are listed below for reference.     Microbiology: Results for orders placed or performed during the hospital encounter of 06/13/23  MRSA Next Gen by PCR, Nasal     Status: None   Collection Time: 06/14/23 12:38 AM   Specimen: Nasal Mucosa; Nasal Swab  Result Value Ref Range Status   MRSA by PCR Next Gen NOT DETECTED NOT DETECTED Final    Comment: (NOTE) The GeneXpert MRSA Assay (FDA approved for NASAL specimens only), is one component of a comprehensive MRSA colonization surveillance program. It is not intended to diagnose MRSA infection nor to guide or monitor treatment for MRSA infections. Test performance is not FDA approved in patients less than 29 years old. Performed at [facility], [address] [place], [place]  [address]     Labs: CBC: Recent Labs  Lab 06/13/23 1705 06/14/23 0623  WBC 14.6* 7.4  NEUTROABS 11.7*  --   HGB 16.8* 12.9  HCT 51.0* 37.3  MCV 96.8 91.4  PLT 466* 296   Basic Metabolic Panel: Recent Labs  Lab 06/13/23 1705 06/13/23 1946 06/13/23 2335 06/14/23 0321 06/14/23 0623 06/14/23 1136  NA 138 134* 133* 134* 134* 134*  K 4.7 4.6 4.0 3.5 3.6 3.7  CL 107 106 108 109 109 105  CO2 9* 13* 14* 15* 18* 17*  GLUCOSE 255* 214* 170* 121* 140* 193*  BUN 18 18 13 12 11 10   CREATININE 1.31* 1.17* 0.85 0.72 0.69 0.80  CALCIUM 8.9 8.5* 8.1* 8.1* 8.0* 8.3*  MG 2.4  --   --   --   --   --    Liver Function Tests:  Recent Labs  Lab 06/13/23 1705  AST 17  ALT 39  ALKPHOS 89  BILITOT 1.4*  PROT 8.1  ALBUMIN 4.5   CBG: Recent Labs  Lab 06/14/23 1212 06/14/23 1341 06/14/23 1450 06/14/23 1556 06/14/23 1658  GLUCAP 204* 162* 153* 174* 141*    Discharge time spent: greater than 30 minutes.  Signed: Charlie Patterson, MD [facility] 06/14/2023

## 2023-06-14 NOTE — TOC Initial Note (Signed)
 Transition of Care [facility]) - Initial/Assessment Note    Patient Details  Name: Cheryl Avila MRN: [id number] Date of Birth: Aug 16, 1995  [facility] CM/SW Contact:    Lauraine JAYSON Carpen, LCSW Phone Number: 06/14/2023, 4:49 PM  Clinical Narrative: CSW met with patient. No supports at bedside. CSW introduced role and inquired about not having a PCP or insurance which she confirmed. CSW provided packet for free/low-cost healthcare in [facility] and intake paperwork for the [employer]. No further concerns. Pharmacy is following for discharge medication needs. CSW will continue to follow patient's progress and facilitate return home when stable.                 Expected Discharge Plan: Home/Self Care Barriers to Discharge: No Barriers Identified   Patient Goals and CMS Choice            Expected Discharge Plan and Services     Post Acute Care Choice: NA Living arrangements for the past 2 months: Single Family Home                                      Prior Living Arrangements/Services Living arrangements for the past 2 months: Single Family Home   Patient language and need for interpreter reviewed:: Yes Do you feel safe going back to the place where you live?: Yes      Need for Family Participation in Patient Care: Yes (Comment)     Criminal Activity/Legal Involvement Pertinent to Current Situation/Hospitalization: No - Comment as needed  Activities of Daily Living   ADL Screening (condition at time of admission) Independently performs ADLs?: Yes (appropriate for developmental age) Is the patient deaf or have difficulty hearing?: No Does the patient have difficulty seeing, even when wearing glasses/contacts?: No Does the patient have difficulty concentrating, remembering, or making decisions?: No  Permission Sought/Granted                  Emotional Assessment Appearance:: Appears stated age Attitude/Demeanor/Rapport: Engaged,  Gracious Affect (typically observed): Accepting, Appropriate, Calm, Pleasant Orientation: : Oriented to Self, Oriented to Place, Oriented to  Time, Oriented to Situation Alcohol / Substance Use: Not Applicable Psych Involvement: No (comment)  Admission diagnosis:  DKA (diabetic ketoacidosis) (HCC) [E11.10] AKI (acute kidney injury) (HCC) [N17.9] Diabetic ketoacidosis without coma associated with type 1 diabetes mellitus (HCC) [E10.10] Patient Active Problem List   Diagnosis Date Noted   DKA (diabetic ketoacidosis) (HCC) 06/13/2023   Leukocytosis 06/13/2023   AKI (acute kidney injury) (HCC) 06/13/2023   Hyperemesis complicating pregnancy, antepartum 05/14/2021   Hyperemesis 04/23/2021   Hyperemesis gravidarum 04/23/2021   Type 1 diabetes mellitus with hyperglycemia (HCC) 04/22/2021   Supervision of high risk pregnancy in first trimester 04/12/2021   [redacted] weeks gestation of pregnancy    Abnormal findings on antenatal screening    Anxiety 10/18/2015   PCP:  [facility] Pharmacy:   [id number] [place], [place] - [address] AT [facility] OF [address] [place] [address] Phone: [phone] Fax: [phone]  [facility] [address] [place] [address] Phone: [phone] Fax: [phone]     Social Drivers of Health (SDOH) Social History: SDOH Screenings   Food Insecurity: No Food Insecurity (06/14/2023)  Housing: High Risk (06/14/2023)  Transportation Needs: No Transportation Needs (06/14/2023)  Utilities: Not At Risk (06/14/2023)  Financial Resource Strain: Low Risk  (08/28/2022)   Received from [facility], [facility]  Physical Activity: Sufficiently Active (08/28/2022)   Received from [facility], [facility]  Stress: Stress Concern Present (08/28/2022)   Received from [facility], [facility]  Tobacco Use: Low Risk  (06/14/2023)   SDOH Interventions:     Readmission Risk Interventions     No data to display

## 2023-06-14 NOTE — Assessment & Plan Note (Signed)
 Likely reactive from vomiting

## 2023-06-14 NOTE — Assessment & Plan Note (Signed)
 BMI 35.18 with last height and weight in computer.

## 2023-06-25 LAB — BLOOD GAS, VENOUS
Acid-base deficit: 16.9 mmol/L — ABNORMAL HIGH (ref 0.0–2.0)
Bicarbonate: 11.3 mmol/L — ABNORMAL LOW (ref 20.0–28.0)
O2 Saturation: 32.6 %
Patient temperature: 37
pCO2, Ven: 34 mm[Hg] — ABNORMAL LOW (ref 44–60)
pH, Ven: 7.13 — CL (ref 7.25–7.43)

## 2023-06-28 ENCOUNTER — Ambulatory Visit: Payer: Medicaid Other | Admitting: Gerontology

## 2023-06-28 VITALS — BP 116/78 | HR 85 | Ht 62.5 in | Wt 178.6 lb

## 2023-06-28 DIAGNOSIS — Z7689 Persons encountering health services in other specified circumstances: Secondary | ICD-10-CM | POA: Insufficient documentation

## 2023-06-28 DIAGNOSIS — E1065 Type 1 diabetes mellitus with hyperglycemia: Secondary | ICD-10-CM

## 2023-06-28 LAB — POCT GLYCOSYLATED HEMOGLOBIN (HGB A1C): Hemoglobin A1C: 9.5 % — AB (ref 4.0–5.6)

## 2023-06-28 LAB — GLUCOSE, POCT (MANUAL RESULT ENTRY): POC Glucose: 174 mg/dL — AB (ref 70–99)

## 2023-06-28 MED ORDER — INSULIN LISPRO (1 UNIT DIAL) 100 UNIT/ML (KWIKPEN)
PEN_INJECTOR | SUBCUTANEOUS | 3 refills | Status: DC
  Filled 2023-06-28: qty 15, fill #0

## 2023-06-28 MED ORDER — BLOOD GLUCOSE TEST VI STRP
1.0000 | ORAL_STRIP | Freq: Three times a day (TID) | 2 refills | Status: AC
  Filled 2023-06-28 – 2023-09-18 (×2): qty 200, 67d supply, fill #0
  Filled 2023-10-30: qty 200, 67d supply, fill #1

## 2023-06-28 MED ORDER — BASAGLAR KWIKPEN 100 UNIT/ML ~~LOC~~ SOPN
38.0000 [IU] | PEN_INJECTOR | Freq: Every day | SUBCUTANEOUS | 3 refills | Status: DC
  Filled 2023-06-28: qty 15, 39d supply, fill #0

## 2023-06-28 MED ORDER — TRUEPLUS LANCETS 33G MISC
1.0000 | Freq: Three times a day (TID) | 2 refills | Status: AC
  Filled 2023-06-28 – 2023-09-18 (×2): qty 200, 30d supply, fill #0

## 2023-06-28 MED ORDER — INSULIN PEN NEEDLE 31G X 5 MM MISC
1.0000 | Freq: Three times a day (TID) | 2 refills | Status: DC
  Filled 2023-06-28: qty 200, 50d supply, fill #0

## 2023-06-28 NOTE — Progress Notes (Deleted)
   Established Patient Office Visit  Subjective   Patient ID: Cheryl Avila, female    DOB: [birth year]  Age: 28 y.o. MRN: [id number]  No chief complaint on file.   HPI    ROS    Objective:     LMP 06/04/2023  {Vitals History (Optional):23777}  Physical Exam   No results found for any visits on 06/28/23.  {Labs (Optional):23779}  The ASCVD Risk score (Arnett DK, et al., 2019) failed to calculate for the following reasons:   The 2019 ASCVD risk score is only valid for ages 56 to 71    Assessment & Plan:   Problem List Items Addressed This Visit   None   No follow-ups on file.    Jaelon Gatley Trellis Paganini, NP

## 2023-06-28 NOTE — Patient Instructions (Signed)

## 2023-06-28 NOTE — Progress Notes (Signed)
New Patient Office Visit  Subjective    Patient ID: Cheryl Avila, female    DOB: [birth year]  Age: 28 y.o. MRN: [id number]  CC:  Chief Complaint  Patient presents with   Diabetes    HPI Cheryl Avila is a 28 y/o female who has history of Type 1 diabetes, Obesity who presents to establish care. She was discharged from [facility] on 06/14/23. During her hospital course, she was treated for DKA, AKI, and Hyponatremia. Her HgbA1c checked during visit was 9.5% and blood glucose was 174 mg/dl. She states that she checks her blood glucose 2-3 times daily. Her fasting reading this morning was 227 mg/dl. She states that she's compliant with her medications, denies side effects and continues to make healthy lifestyle changes. She denies hypo/hyperglycemic symptoms, peripheral neuropathy and performs daily foot checks. Overall, she states that she's doing well and offers no further complaint.     Outpatient Encounter Medications as of 06/28/2023  Medication Sig   Blood Glucose Monitoring Suppl (BLOOD GLUCOSE MONITOR SYSTEM) w/Device KIT Test morning, at noon, and at bedtime.   Lancet Device MISC Test blood sugars in the morning, at noon, and at bedtime.   ondansetron (ZOFRAN) 4 MG tablet Take 1 tablet (4 mg total) by mouth every 8 (eight) hours as needed for nausea or vomiting.   [DISCONTINUED] Glucose Blood (BLOOD GLUCOSE TEST STRIPS) STRP Test sugars in the morning, at noon, and at bedtime.   [DISCONTINUED] Insulin Glargine (BASAGLAR KWIKPEN) 100 UNIT/ML Inject 35 Units into the skin daily.   [DISCONTINUED] insulin lispro (HUMALOG) 100 UNIT/ML KwikPen If eating and Blood Glucose (BG) 80 or higher inject 6 units for meal coverage and add correction dose per scale. If not eating, correction dose only. BG <150= 0 unit; BG 150-200= 1 unit; BG 201-250= 3 unit; BG 251-300= 5 unit; BG 301-350= 7 unit; BG 351-400= 9 unit; BG >400= 11 unit and Call Primary Care.   [DISCONTINUED] Insulin Pen  Needle 31G X 5 MM MISC Use 3 (three) times daily before meals and at bedtime with lantus   [DISCONTINUED] TRUEplus Lancets 33G MISC Check blood sugars in the morning, at noon, and at bedtime.   Glucose Blood (BLOOD GLUCOSE TEST STRIPS) STRP Test sugars in the morning, at noon, and at bedtime.   Insulin Glargine (BASAGLAR KWIKPEN) 100 UNIT/ML Inject 38 Units into the skin daily.   insulin lispro (HUMALOG) 100 UNIT/ML KwikPen If eating and Blood Glucose (BG) 80 or higher inject 6 units for meal coverage and add correction dose per scale. If not eating, correction dose only. BG <150= 0 unit; BG 150-200= 1 unit; BG 201-250= 3 unit; BG 251-300= 5 unit; BG 301-350= 7 unit; BG 351-400= 9 unit; BG >400= 11 unit and Call Primary Care.   Insulin Pen Needle 31G X 5 MM MISC Use 3 (three) times daily before meals and at bedtime with lantus   TRUEplus Lancets 33G MISC Check blood sugars in the morning, at noon, and at bedtime.   No facility-administered encounter medications on file as of 06/28/2023.    Past Medical History:  Diagnosis Date   Diabetes mellitus without complication (HCC)     Past Surgical History:  Procedure Laterality Date   CESAREAN SECTION     NO PAST SURGERIES      Family History  Problem Relation Age of Onset   Diabetes Maternal Grandmother     Social History   Socioeconomic History   Marital status: Married  Spouse name: Gerilyn Pilgrim   Number of children: Not on file   Years of education: Not on file   Highest education level: Not on file  Occupational History   Not on file  Tobacco Use   Smoking status: Never   Smokeless tobacco: Never  Vaping Use   Vaping status: Never Used  Substance and Sexual Activity   Alcohol use: No   Drug use: No   Sexual activity: Yes  Other Topics Concern   Not on file  Social History Narrative   Not on file   Social Drivers of Health   Financial Resource Strain: Low Risk  (08/28/2022)   Received from [facility],  [facility]   Overall Financial Resource Strain (CARDIA)    Difficulty of Paying Living Expenses: Not hard at all  Food Insecurity: No Food Insecurity (06/14/2023)   Hunger Vital Sign    Worried About Running Out of Food in the Last Year: Never true    Ran Out of Food in the Last Year: Never true  Transportation Needs: No Transportation Needs (06/14/2023)   PRAPARE - Administrator, Civil Service (Medical): No    Lack of Transportation (Non-Medical): No  Physical Activity: Sufficiently Active (08/28/2022)   Received from [facility], [facility]   Exercise Vital Sign    Days of Exercise per Week: 3 days    Minutes of Exercise per Session: 60 min  Stress: Stress Concern Present (08/28/2022)   Received from [facility], [facility]   [employer] of Occupational Health - Occupational Stress Questionnaire    Feeling of Stress : Very much  Social Connections: Not on file  Intimate Partner Violence: Not At Risk (06/14/2023)   Humiliation, Afraid, Rape, and Kick questionnaire    Fear of Current or Ex-Partner: No    Emotionally Abused: No    Physically Abused: No    Sexually Abused: No    Review of Systems  Constitutional: Negative.   HENT: Negative.    Eyes: Negative.   Respiratory: Negative.    Cardiovascular: Negative.   Gastrointestinal: Negative.   Genitourinary: Negative.   Musculoskeletal: Negative.   Skin: Negative.   Neurological: Negative.   Endo/Heme/Allergies: Negative.   Psychiatric/Behavioral: Negative.          Objective    BP 116/78 (BP Location: Left Arm, Patient Position: Sitting)   Pulse 85   Ht 5' 2.5" (1.588 m)   Wt 178 lb 9.6 oz (81 kg)   LMP 06/04/2023   SpO2 98%   BMI 32.15 kg/m   Physical Exam Constitutional:      Appearance: Normal appearance.  HENT:     Head: Normocephalic and atraumatic.     Nose: Nose normal.     Mouth/Throat:      Mouth: Mucous membranes are moist.  Eyes:     Extraocular Movements: Extraocular movements intact.     Conjunctiva/sclera: Conjunctivae normal.     Pupils: Pupils are equal, round, and reactive to light.  Cardiovascular:     Rate and Rhythm: Normal rate and regular rhythm.     Pulses: Normal pulses.     Heart sounds: Normal heart sounds.  Pulmonary:     Effort: Pulmonary effort is normal.     Breath sounds: Normal breath sounds.  Abdominal:     General: Bowel sounds are normal.     Palpations: Abdomen is soft.  Genitourinary:  Comments: Deferred per patient Musculoskeletal:        General: Normal range of motion.     Cervical back: Normal range of motion.  Skin:    General: Skin is warm.     Capillary Refill: Capillary refill takes less than 2 seconds.  Neurological:     General: No focal deficit present.     Mental Status: She is alert and oriented to person, place, and time. Mental status is at baseline.  Psychiatric:        Mood and Affect: Mood normal.        Behavior: Behavior normal.        Thought Content: Thought content normal.        Judgment: Judgment normal.        Assessment & Plan:   1. Encounter to establish care (Primary) - Her routine labs will be checked, was encouraged to follow up with Discharge summary.  2. Type 1 diabetes mellitus with hyperglycemia (HCC) - Her HgbA1c was 9.5%, her goal should be less than 6%. She was advised to continue on current medication, check blood glucose tid, record and bring log to follow up appointment. She was advised to continue on low carb/non concentrated sweet diet and exercise as tolerated. - POCT HgB A1C; Future - POCT Glucose (CBG); Future - Insulin Glargine (BASAGLAR KWIKPEN) 100 UNIT/ML; Inject 38 Units into the skin daily.  Dispense: 15 mL; Refill: 3 - insulin lispro (HUMALOG) 100 UNIT/ML KwikPen; If eating and Blood Glucose (BG) 80 or higher inject 6 units for meal coverage and add correction dose per scale.  If not eating, correction dose only. BG <150= 0 unit; BG 150-200= 1 unit; BG 201-250= 3 unit; BG 251-300= 5 unit; BG 301-350= 7 unit; BG 351-400= 9 unit; BG >400= 11 unit and Call Primary Care.  Dispense: 15 mL; Refill: 3 - Insulin Pen Needle 31G X 5 MM MISC; Use 3 (three) times daily before meals and at bedtime with lantus  Dispense: 200 each; Refill: 2 - TRUEplus Lancets 33G MISC; Check blood sugars in the morning, at noon, and at bedtime.  Dispense: 200 each; Refill: 2 - Glucose Blood (BLOOD GLUCOSE TEST STRIPS) STRP; Test sugars in the morning, at noon, and at bedtime.  Dispense: 200 strip; Refill: 2 - Urine Microalbumin w/creat. ratio; Future - POCT Glucose (CBG) - Urine Microalbumin w/creat. ratio - POCT HgB A1C   Return in about 3 months (around 09/26/2023), or if symptoms worsen or fail to improve.   Kasidee Voisin Trellis Paganini, NP

## 2023-06-29 ENCOUNTER — Other Ambulatory Visit: Payer: Self-pay

## 2023-06-30 LAB — MICROALBUMIN / CREATININE URINE RATIO
Creatinine, Urine: 292.2 mg/dL
Microalb/Creat Ratio: 5 mg/g{creat} (ref 0–29)
Microalbumin, Urine: 14.9 ug/mL

## 2023-07-26 ENCOUNTER — Ambulatory Visit: Payer: Medicaid Other | Admitting: Gerontology

## 2023-07-26 VITALS — BP 119/87 | HR 99 | Ht 62.5 in | Wt 180.7 lb

## 2023-07-26 DIAGNOSIS — E1065 Type 1 diabetes mellitus with hyperglycemia: Secondary | ICD-10-CM

## 2023-07-26 MED ORDER — INSULIN LISPRO (1 UNIT DIAL) 100 UNIT/ML (KWIKPEN)
PEN_INJECTOR | SUBCUTANEOUS | 3 refills | Status: DC
  Filled 2023-07-26: qty 15, 45d supply, fill #0
  Filled 2023-09-18: qty 15, 30d supply, fill #0
  Filled 2023-10-30: qty 15, 30d supply, fill #1
  Filled 2024-02-19: qty 15, 30d supply, fill #2

## 2023-07-26 MED ORDER — BASAGLAR KWIKPEN 100 UNIT/ML ~~LOC~~ SOPN
32.0000 [IU] | PEN_INJECTOR | Freq: Every day | SUBCUTANEOUS | 3 refills | Status: DC
  Filled 2023-07-26 – 2023-08-14 (×2): qty 15, 46d supply, fill #0
  Filled 2023-10-30: qty 15, 46d supply, fill #1
  Filled 2024-02-19: qty 15, 46d supply, fill #2

## 2023-07-26 MED ORDER — INSULIN PEN NEEDLE 31G X 5 MM MISC
1.0000 | Freq: Three times a day (TID) | 2 refills | Status: DC
  Filled 2023-07-26 – 2023-09-18 (×3): qty 200, 50d supply, fill #0
  Filled 2023-10-30: qty 200, 50d supply, fill #1
  Filled 2024-02-19: qty 200, 50d supply, fill #2

## 2023-07-26 NOTE — Patient Instructions (Signed)

## 2023-07-27 ENCOUNTER — Other Ambulatory Visit: Payer: Self-pay

## 2023-07-28 NOTE — Progress Notes (Signed)
 [facility]inology Visit   Assessment & Plan:   1. Type I Diabetes Patient has a history of  Type 1 Diabetes, recently lost insurance. Was hospitalized from 06/13/23-06/14/23 for DKA. T1DM currently not well-managed with HA1c of 9.5% and repeated episodes of hypoglycemia. Due to nighttime hypoglycemic episodes, will decrease basal insulin.  - Referred [facility] to establish care - Decrease Glargine from 38 to 32 units daily  - Counseled on carb counting and using carb ratio of 1:10 for mealtime insulin   Subjective    Patient ID: Cheryl Avila, female    DOB: Nov 30, 1995  Age: 28 y.o. MRN: [id number]  CC:  Chief Complaint  Patient presents with   Diabetes    HPI Cheryl Avila is a 28 y/o female who has history of ~ 12 years Type 1 diabetes, Obesity who presents to for diabetes follow-up. Overall, she reports feeling good.  She was hospitalized at [facility] from 1/1-06/14/23 and treated for DKA, AKI, and Hyponatremia. Her glucose control is poor with an HgbA1c of 9.5% on 06/28/23 and POC glucose of 283 on 07/26/23. She regularly monitors her glucose levels and is compliant with medications. She was taking 38 units of glargine and SSI insulin for mealtime, which usually was 20 units lispro. She reports morning preprandial glucose levels of around 180. The highest glucose level seen is >300 post-prandial and lowest seen is 29 at midnight. She reports episodes of hypoglycemia around 1/wk, where she feels shaky/sweaty, usually at midnight, 5-7 hrs after eating dinner. During these episodes, she reports her BG level is usually around 40-50.   She denies peripheral neuropathy and performs daily foot checks. She denies changes in vision.  She tries to eat healthy, including vegetable and chicken wraps. She does not smoke, use alcohol, or other recreational substances. She lives at home at takes care of her 3 kids.      Outpatient Encounter Medications as of 07/26/2023   Medication Sig   Blood Glucose Monitoring Suppl (BLOOD GLUCOSE MONITOR SYSTEM) w/Device KIT Test morning, at noon, and at bedtime.   Glucose Blood (BLOOD GLUCOSE TEST STRIPS) STRP Test sugars in the morning, at noon, and at bedtime.   ondansetron (ZOFRAN) 4 MG tablet Take 1 tablet (4 mg total) by mouth every 8 (eight) hours as needed for nausea or vomiting.   TRUEplus Lancets 33G MISC Check blood sugars in the morning, at noon, and at bedtime.   [DISCONTINUED] Insulin Glargine (BASAGLAR KWIKPEN) 100 UNIT/ML Inject 38 Units into the skin daily.   [DISCONTINUED] insulin lispro (HUMALOG) 100 UNIT/ML KwikPen If eating and Blood Glucose (BG) 80 or higher inject 6 units for meal coverage and add correction dose per scale. If not eating, correction dose only. BG <150= 0 unit; BG 150-200= 1 unit; BG 201-250= 3 unit; BG 251-300= 5 unit; BG 301-350= 7 unit; BG 351-400= 9 unit; BG >400= 11 unit and Call Primary Care.   [DISCONTINUED] Insulin Pen Needle 31G X 5 MM MISC Use 3 (three) times daily before meals and at bedtime with lantus   Insulin Glargine (BASAGLAR KWIKPEN) 100 UNIT/ML Inject 32 Units into the skin daily.   insulin lispro (HUMALOG) 100 UNIT/ML KwikPen If eating and Blood Glucose (BG) 80 or higher inject 6 units for meal coverage and add correction dose per scale. If not eating, correction dose only. BG <150= 0 unit; BG 150-200= 1 unit; BG 201-250= 3 unit; BG 251-300= 5 unit; BG 301-350= 7 unit; BG 351-400= 9 unit; BG >400= 11  unit and Call Primary Care.   Insulin Pen Needle 31G X 5 MM MISC Use 3 (three) times daily before meals and at bedtime with lantus   No facility-administered encounter medications on file as of 07/26/2023.    Past Medical History:  Diagnosis Date   Diabetes mellitus without complication (HCC)     Past Surgical History:  Procedure Laterality Date   CESAREAN SECTION     NO PAST SURGERIES          Objective    BP 119/87 (BP Location: Right Arm, Patient Position:  Sitting, Cuff Size: Normal)   Pulse 99   Ht 5' 2.5" (1.588 m)   Wt 180 lb 11.2 oz (82 kg)   LMP 07/06/2023 (Approximate)   SpO2 95%   BMI 32.52 kg/m   Physical Exam Cardiovascular: RRR Respiratory: CTA MSK: Full sensation on foot exam. No peripheral edema, injection site without irritation or erythema      Con Memos, Student-PA

## 2023-08-08 ENCOUNTER — Other Ambulatory Visit: Payer: Self-pay

## 2023-08-14 ENCOUNTER — Other Ambulatory Visit: Payer: Self-pay

## 2023-09-13 ENCOUNTER — Ambulatory Visit: Payer: Self-pay

## 2023-09-18 ENCOUNTER — Other Ambulatory Visit: Payer: Self-pay

## 2023-09-18 IMAGING — US US OB < 14 WEEKS - US OB TV
1 series · 14 of 28 positions shown · non-contrast
Comparison: None.

CLINICAL DATA: Gestational diabetes, pregnant, LMP 01/27/2021

EXAM:
OBSTETRIC <14 WK US AND TRANSVAGINAL OB US
TECHNIQUE: Both transabdominal and transvaginal ultrasound examinations were
performed for complete evaluation of the gestation as well as the
maternal uterus, adnexal regions, and pelvic cul-de-sac.
Transvaginal technique was performed to assess early pregnancy.

[Series 1: us ob comp less 14 wks · 14 of 95 slices shown]
[im 4/95]
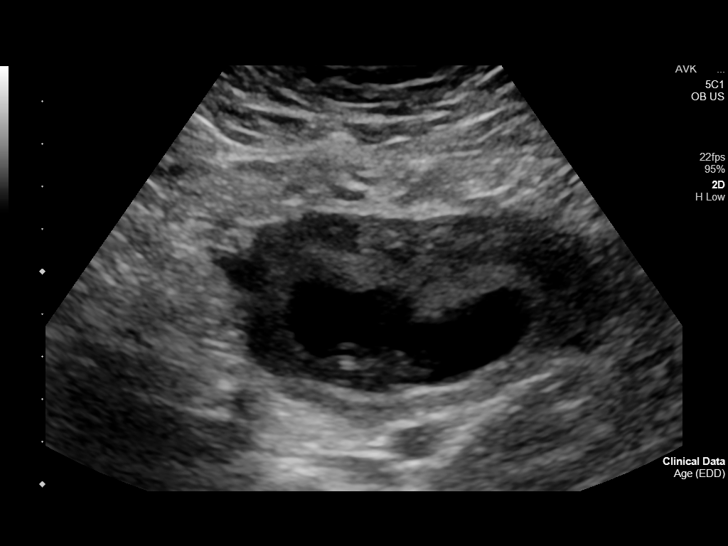
[im 11/95]
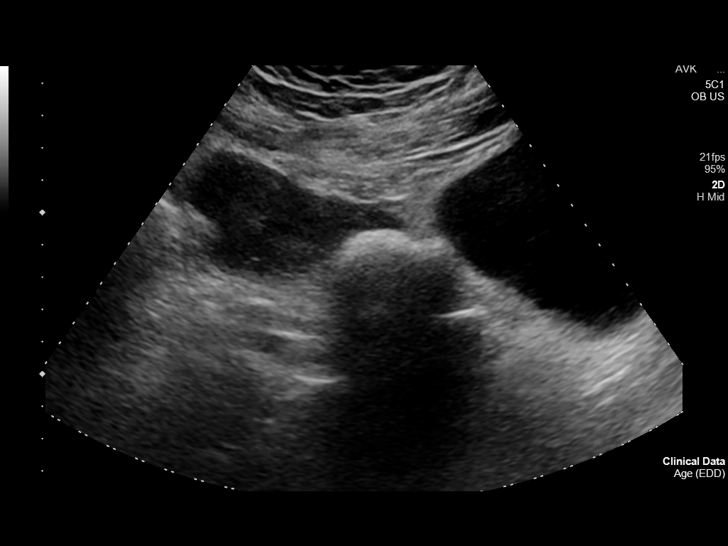
[im 18/95]
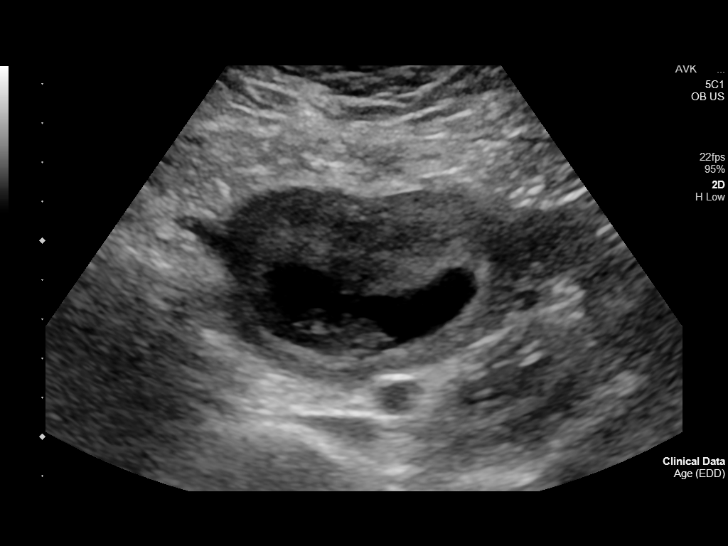
[im 25/95]
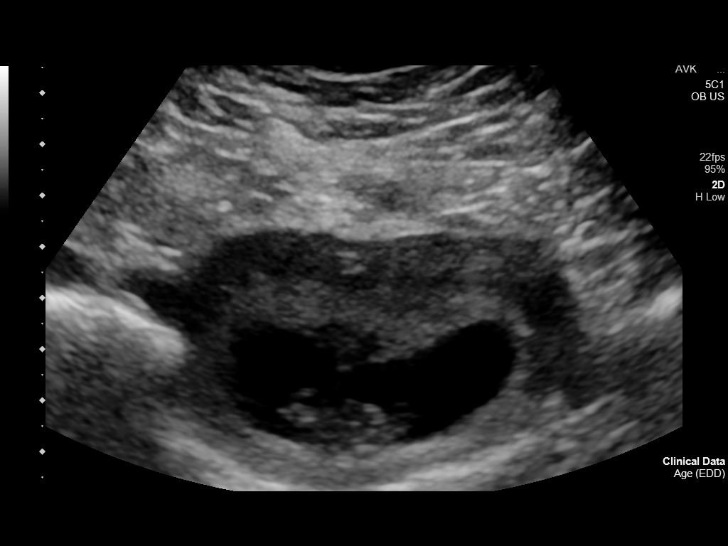
[im 32/95]
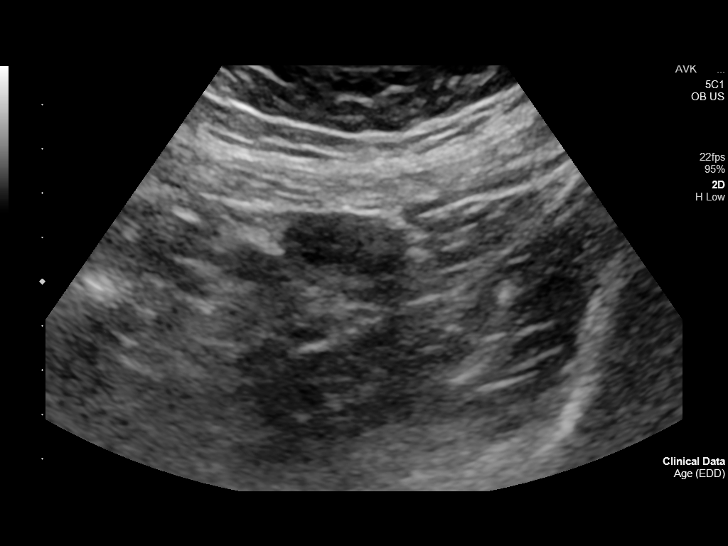
[im 39/95]
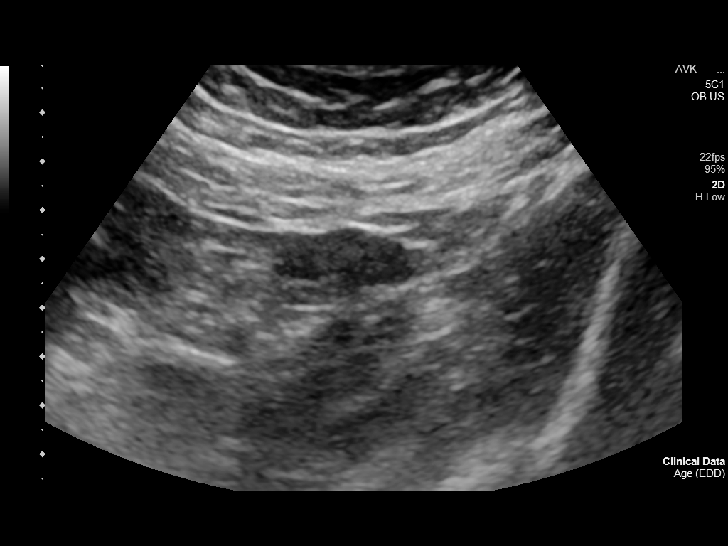
[im 46/95]
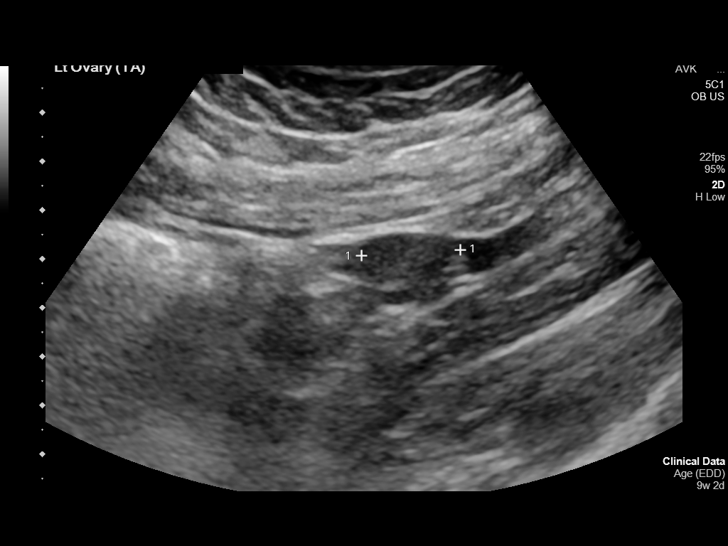
[im 53/95]
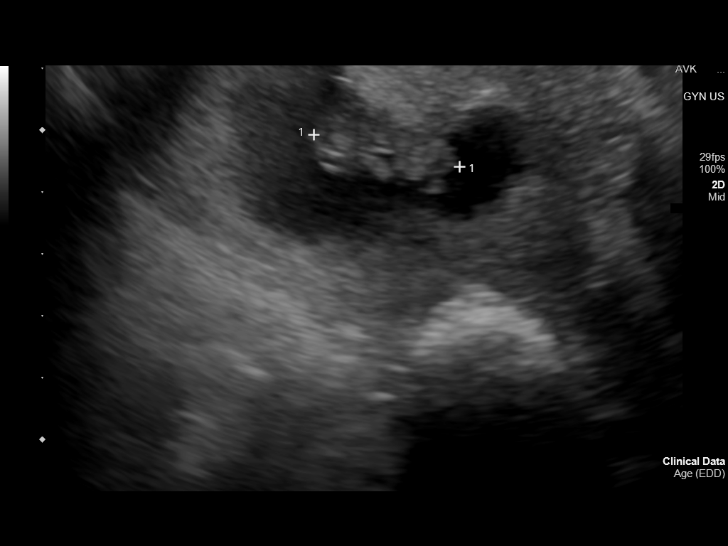
[im 60/95]
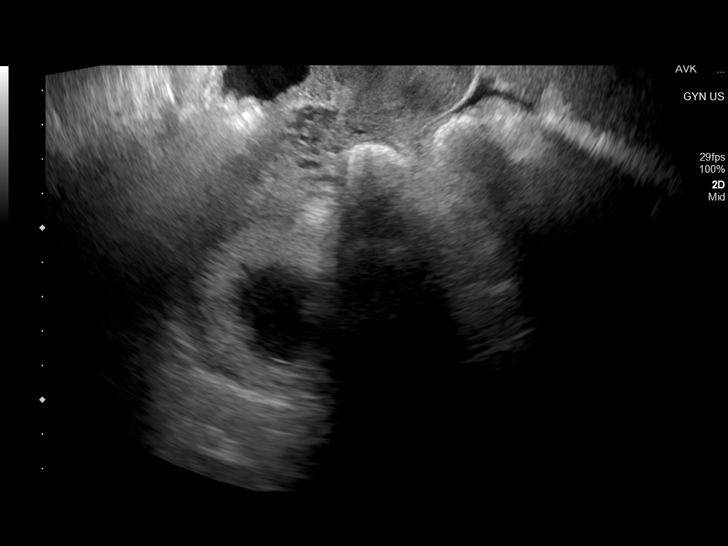
[im 67/95]
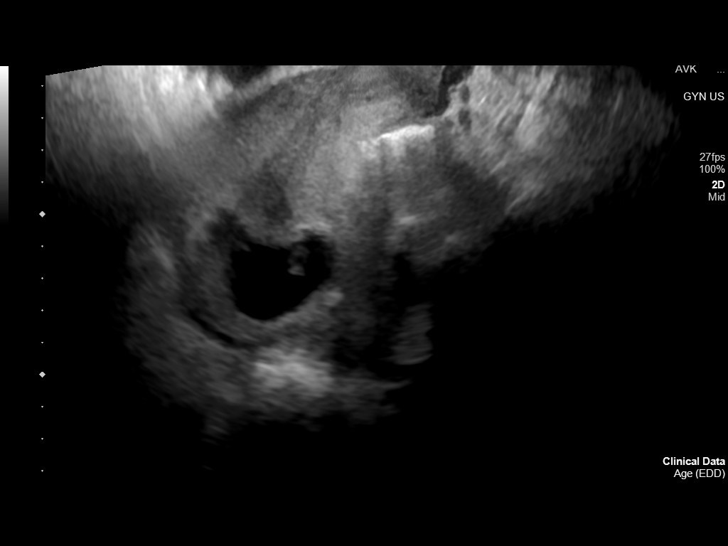
[im 74/95]
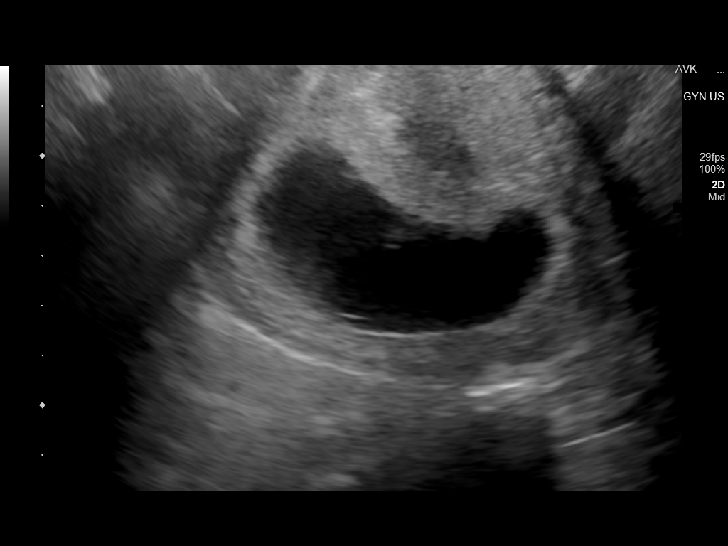
[im 81/95]
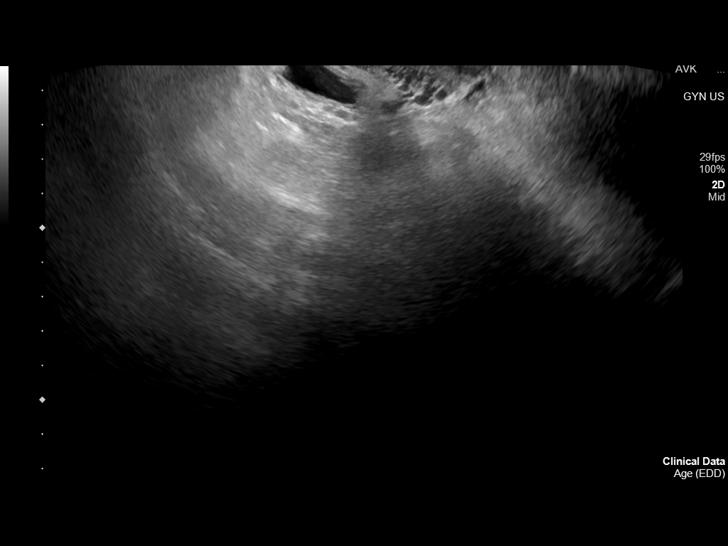
[im 88/95]
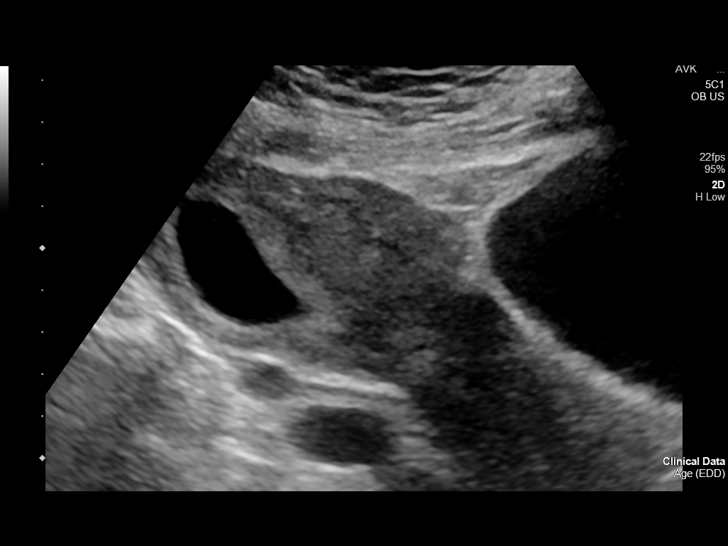
[im 95/95]
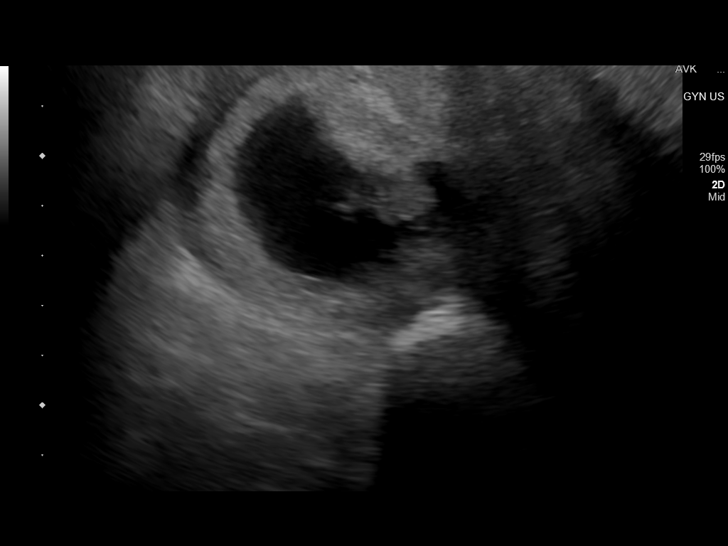

[14 of 28 positions shown; findings below may reference images not displayed]

FINDINGS: Intrauterine gestational sac: Single

Yolk sac:  Visualized.

Embryo:  Visualized.

Cardiac Activity: Choose

Heart Rate: 178 bpm

MSD: Appropriate given fetal size

CRL:  24 mm   9 w   1 d                  US EDC: 11/26/2020

Subchorionic hemorrhage:  None visualized.

Maternal uterus/adnexae: The uterus is anteverted. The cervix is
unremarkable. No intrauterine masses are seen. There is mild simple
appearing free fluid within the pelvis. The maternal ovaries are
unremarkable. No adnexal masses are seen.
IMPRESSION: Single living intrauterine gestation with an estimated gestational
age of 9 weeks, 1 day. No acute abnormality.

## 2023-09-27 ENCOUNTER — Ambulatory Visit: Payer: Self-pay

## 2023-10-18 ENCOUNTER — Ambulatory Visit: Payer: Self-pay

## 2023-10-30 ENCOUNTER — Other Ambulatory Visit: Payer: Self-pay

## 2023-12-13 ENCOUNTER — Ambulatory Visit

## 2024-02-19 ENCOUNTER — Other Ambulatory Visit: Payer: Self-pay

## 2024-04-03 ENCOUNTER — Ambulatory Visit

## 2024-04-22 ENCOUNTER — Ambulatory Visit: Admitting: Gerontology

## 2024-05-29 ENCOUNTER — Ambulatory Visit: Payer: Self-pay | Admitting: Gerontology

## 2024-05-29 ENCOUNTER — Other Ambulatory Visit: Payer: Self-pay

## 2024-05-29 VITALS — BP 113/79 | HR 99 | Ht 63.0 in | Wt 180.0 lb

## 2024-05-29 DIAGNOSIS — E1065 Type 1 diabetes mellitus with hyperglycemia: Secondary | ICD-10-CM

## 2024-05-29 DIAGNOSIS — Z0000 Encounter for general adult medical examination without abnormal findings: Secondary | ICD-10-CM

## 2024-05-29 LAB — POCT GLYCOSYLATED HEMOGLOBIN (HGB A1C): Hemoglobin A1C: 9.4 % — AB (ref 4.0–5.6)

## 2024-05-29 LAB — GLUCOSE, POCT (MANUAL RESULT ENTRY): POC Glucose: 218 mg/dL — AB (ref 70–99)

## 2024-05-29 MED ORDER — INSULIN PEN NEEDLE 31G X 5 MM MISC
1.0000 | Freq: Three times a day (TID) | 2 refills | Status: AC
  Filled 2024-05-29: qty 200, 50d supply, fill #0

## 2024-05-29 MED ORDER — INSULIN LISPRO (1 UNIT DIAL) 100 UNIT/ML (KWIKPEN)
PEN_INJECTOR | SUBCUTANEOUS | 3 refills | Status: AC
  Filled 2024-05-29: qty 15, 29d supply, fill #0

## 2024-05-29 MED ORDER — BASAGLAR KWIKPEN 100 UNIT/ML ~~LOC~~ SOPN
32.0000 [IU] | PEN_INJECTOR | Freq: Every day | SUBCUTANEOUS | 3 refills | Status: AC
  Filled 2024-05-29: qty 15, 46d supply, fill #0

## 2024-05-29 NOTE — Patient Instructions (Signed)

## 2024-05-29 NOTE — Progress Notes (Signed)
 Established Patient Office Visit  Subjective   Patient ID: Cheryl Avila, female    DOB: May 17, 1996  Age: 28 y.o. MRN: [id number]  Chief Complaint  Patient presents with   Follow-up    Pt says she needs to get back on track she has not been checking her glucose   Diabetes    HPI  Cheryl Avila is a 28 y/o female who has history of Type 1 diabetes, Obesity who presents for follow up visit. Was last seen February of 2025. Her HgbA1c checked during visit decreased from 9.5% to 9.4% and her blood glucose was 218 mg per DL.  She states that she checks her blood glucose when she's not feeling good.  She denies hypo-/hyperglycemic symptoms peripheral neuropathy and performs daily foot checks.  She states that she is compliant with her medications, and continues to adhere to ADA diet.  She did not follow-up with [facility] endocrinology team due to family circumstances.  Overall, she states that she is doing well and offers no further complaint.  Review of Systems  Constitutional: Negative.   Eyes: Negative.   Respiratory: Negative.    Cardiovascular: Negative.   Genitourinary: Negative.   Skin: Negative.   Neurological: Negative.   Endo/Heme/Allergies: Negative.   Psychiatric/Behavioral: Negative.        Objective:     BP 113/79 (BP Location: Right Arm, Patient Position: Sitting, Cuff Size: Normal)   Pulse 99   Ht 5' 3 (1.6 m)   Wt 180 lb (81.6 kg)   SpO2 93%   BMI 31.89 kg/m  BP Readings from Last 3 Encounters:  05/29/24 113/79  07/26/23 119/87  06/28/23 116/78   Wt Readings from Last 3 Encounters:  05/29/24 180 lb (81.6 kg)  07/26/23 180 lb 11.2 oz (82 kg)  06/28/23 178 lb 9.6 oz (81 kg)      Physical Exam HENT:     Head: Normocephalic and atraumatic.     Mouth/Throat:     Mouth: Mucous membranes are moist.     Pharynx: Oropharynx is clear.  Eyes:     Extraocular Movements: Extraocular movements intact.     Conjunctiva/sclera: Conjunctivae normal.      Pupils: Pupils are equal, round, and reactive to light.  Cardiovascular:     Rate and Rhythm: Normal rate and regular rhythm.     Pulses: Normal pulses.     Heart sounds: Normal heart sounds.  Pulmonary:     Effort: Pulmonary effort is normal.     Breath sounds: Normal breath sounds.  Skin:    General: Skin is warm.     Capillary Refill: Capillary refill takes less than 2 seconds.  Neurological:     General: No focal deficit present.     Mental Status: She is alert and oriented to person, place, and time.  Psychiatric:        Mood and Affect: Mood normal.        Behavior: Behavior normal.        Thought Content: Thought content normal.        Judgment: Judgment normal.      Results for orders placed or performed in visit on 05/29/24  POCT Glucose (CBG)  Result Value Ref Range   POC Glucose 218 (A) 70 - 99 mg/dl  POCT HgB J8R  Result Value Ref Range   Hemoglobin A1C 9.4 (A) 4.0 - 5.6 %   HbA1c POC (<> result, manual entry)     HbA1c, POC (prediabetic  range)     HbA1c, POC (controlled diabetic range)      Last CBC Lab Results  Component Value Date   WBC 7.4 06/14/2023   HGB 12.9 06/14/2023   HCT 37.3 06/14/2023   MCV 91.4 06/14/2023   MCH 31.6 06/14/2023   RDW 12.4 06/14/2023   PLT 296 06/14/2023   Last metabolic panel Lab Results  Component Value Date   GLUCOSE 193 (H) 06/14/2023   NA 134 (L) 06/14/2023   K 3.7 06/14/2023   CL 105 06/14/2023   CO2 17 (L) 06/14/2023   BUN 10 06/14/2023   CREATININE 0.80 06/14/2023   GFRNONAA >60 06/14/2023   CALCIUM 8.3 (L) 06/14/2023   PHOS 4.5 05/13/2021   PROT 8.1 06/13/2023   ALBUMIN 4.5 06/13/2023   BILITOT 1.4 (H) 06/13/2023   ALKPHOS 89 06/13/2023   AST 17 06/13/2023   ALT 39 06/13/2023   ANIONGAP 12 06/14/2023   Last lipids No results found for: CHOL, HDL, LDLCALC, LDLDIRECT, TRIG, CHOLHDL Last hemoglobin A1c Lab Results  Component Value Date   HGBA1C 9.4 (A) 05/29/2024   Last thyroid  functions Lab Results  Component Value Date   TSH 2.642 05/13/2021   Last vitamin D No results found for: 25OHVITD2, 25OHVITD3, VD25OH Last vitamin B12 and Folate No results found for: VITAMINB12, FOLATE    The ASCVD Risk score (Arnett DK, et al., 2019) failed to calculate for the following reasons:   The 2019 ASCVD risk score is only valid for ages 55 to 36   * - Cholesterol units were assumed    Assessment & Plan:    1. Type 1 diabetes mellitus with hyperglycemia (HCC) (Primary) -Her diabetes is not under control, her hemoglobin A1c was 9.4% and her goal should be less than 6%.  She was commended for controlling her hemoglobin A1c to she was last seen at the clinic 10 months ago.  She agrees to continue her current medication, check her blood glucose 3-4 times daily, record and bring the log to follow-up appointment.  She was advised to continue on low carbohydrate/non concentrated sweet diet and exercise as tolerated.  She will follow-up with [facility] endocrinology team in February. - POCT Glucose (CBG) - POCT HgB A1C - insulin  lispro (HUMALOG ) 100 UNIT/ML KwikPen; If eating and Blood Glucose (BG) 80 or higher inject 6 units for meal coverage and add correction dose per scale. If not eating, correction dose only. BG <150= 0 unit; BG 150-200= 1 unit; BG 201-250= 3 unit; BG 251-300= 5 unit; BG 301-350= 7 unit; BG 351-400= 9 unit; BG >400= 11 unit and Call Primary Care.  Dispense: 15 mL; Refill: 3 - Insulin  Glargine (BASAGLAR  KWIKPEN) 100 UNIT/ML; Inject 32 Units into the skin daily.  Dispense: 15 mL; Refill: 3 - Insulin  Pen Needle 31G X 5 MM MISC; Use 3 (three) times daily before meals and at bedtime with lantus   Dispense: 200 each; Refill: 2 - Comp Met (CMET); Future - Microalbumin / creatinine urine ratio; Future - Urinalysis; Future - Ambulatory referral to Endocrinology - Comp Met (CMET) - Microalbumin / creatinine urine ratio - Urinalysis  2. Health care  maintenance -Routine labs will be checked. - CBC w/Diff; Future - Lipid panel; Future - Lipid panel - CBC w/Diff   Return in about 3 months (around 08/26/2024), or if symptoms worsen or fail to improve.    Gery Sabedra E Fatoumata Albaugh, NP

## 2024-05-30 LAB — CBC WITH DIFFERENTIAL/PLATELET
Basophils Absolute: 0.1 x10E3/uL (ref 0.0–0.2)
Basos: 1 %
EOS (ABSOLUTE): 0.1 x10E3/uL (ref 0.0–0.4)
Eos: 2 %
Hematocrit: 44.6 % (ref 34.0–46.6)
Hemoglobin: 15 g/dL (ref 11.1–15.9)
Immature Grans (Abs): 0 x10E3/uL (ref 0.0–0.1)
Immature Granulocytes: 0 %
Lymphocytes Absolute: 2 x10E3/uL (ref 0.7–3.1)
Lymphs: 35 %
MCH: 31.6 pg (ref 26.6–33.0)
MCHC: 33.6 g/dL (ref 31.5–35.7)
MCV: 94 fL (ref 79–97)
Monocytes Absolute: 0.3 x10E3/uL (ref 0.1–0.9)
Monocytes: 4 %
Neutrophils Absolute: 3.3 x10E3/uL (ref 1.4–7.0)
Neutrophils: 58 %
Platelets: 316 x10E3/uL (ref 150–450)
RBC: 4.74 x10E6/uL (ref 3.77–5.28)
RDW: 12 % (ref 11.7–15.4)
WBC: 5.8 x10E3/uL (ref 3.4–10.8)

## 2024-05-30 LAB — COMPREHENSIVE METABOLIC PANEL WITH GFR
ALT: 25 IU/L (ref 0–32)
AST: 24 IU/L (ref 0–40)
Albumin: 4 g/dL (ref 4.0–5.0)
Alkaline Phosphatase: 91 IU/L (ref 41–116)
BUN/Creatinine Ratio: 13 (ref 9–23)
BUN: 12 mg/dL (ref 6–20)
Bilirubin Total: 0.6 mg/dL (ref 0.0–1.2)
CO2: 20 mmol/L (ref 20–29)
Calcium: 9.3 mg/dL (ref 8.7–10.2)
Chloride: 105 mmol/L (ref 96–106)
Creatinine, Ser: 0.95 mg/dL (ref 0.57–1.00)
Globulin, Total: 2.7 g/dL (ref 1.5–4.5)
Glucose: 201 mg/dL — ABNORMAL HIGH (ref 70–99)
Potassium: 4.3 mmol/L (ref 3.5–5.2)
Sodium: 141 mmol/L (ref 134–144)
Total Protein: 6.7 g/dL (ref 6.0–8.5)
eGFR: 84 mL/min/1.73

## 2024-05-30 LAB — LIPID PANEL
Chol/HDL Ratio: 3.3 ratio (ref 0.0–4.4)
Cholesterol, Total: 212 mg/dL — ABNORMAL HIGH (ref 100–199)
HDL: 64 mg/dL
LDL Chol Calc (NIH): 122 mg/dL — ABNORMAL HIGH (ref 0–99)
Triglycerides: 149 mg/dL (ref 0–149)
VLDL Cholesterol Cal: 26 mg/dL (ref 5–40)

## 2024-05-30 LAB — URINALYSIS
Bilirubin, UA: NEGATIVE
Ketones, UA: NEGATIVE
Leukocytes,UA: NEGATIVE
Nitrite, UA: NEGATIVE
Protein,UA: NEGATIVE
RBC, UA: NEGATIVE
Urobilinogen, Ur: 0.2 mg/dL (ref 0.2–1.0)
pH, UA: 5 (ref 5.0–7.5)

## 2024-05-30 LAB — MICROALBUMIN / CREATININE URINE RATIO
Creatinine, Urine: 165.5 mg/dL
Microalb/Creat Ratio: <2 mg/g{creat} (ref 0–29)
Microalbumin, Urine: <3 ug/mL

## 2024-06-02 ENCOUNTER — Other Ambulatory Visit: Payer: Self-pay

## 2024-06-03 ENCOUNTER — Other Ambulatory Visit: Payer: Self-pay

## 2024-07-24 ENCOUNTER — Ambulatory Visit: Payer: Self-pay

## 2024-08-26 ENCOUNTER — Ambulatory Visit: Payer: Self-pay | Admitting: Gerontology
